# Patient Record
Sex: Female | Born: 1955 | Race: White | Hispanic: No | Marital: Single | Smoking: Former smoker
Health system: Southern US, Community
[De-identification: ages and names within clinical notes are randomized; demographics above are authoritative.]

## PROBLEM LIST (undated history)

## (undated) DIAGNOSIS — I10 Essential (primary) hypertension: Secondary | ICD-10-CM

## (undated) DIAGNOSIS — E669 Obesity, unspecified: Secondary | ICD-10-CM

## (undated) DIAGNOSIS — Z8719 Personal history of other diseases of the digestive system: Secondary | ICD-10-CM

## (undated) DIAGNOSIS — Z8711 Personal history of peptic ulcer disease: Secondary | ICD-10-CM

## (undated) HISTORY — PX: GASTRIC BYPASS: SHX52

## (undated) HISTORY — PX: ABDOMINAL HYSTERECTOMY: SHX81

## (undated) HISTORY — PX: HERNIA REPAIR: SHX51

## (undated) HISTORY — PX: ABDOMINAL SURGERY: SHX537

---

## 2009-03-27 ENCOUNTER — Emergency Department (HOSPITAL_COMMUNITY): Admission: EM | Admit: 2009-03-27 | Discharge: 2009-03-27 | Payer: Self-pay | Admitting: Emergency Medicine

## 2010-04-04 ENCOUNTER — Emergency Department (HOSPITAL_COMMUNITY): Admission: EM | Admit: 2010-04-04 | Discharge: 2010-04-04 | Payer: Self-pay | Admitting: Emergency Medicine

## 2010-04-09 ENCOUNTER — Emergency Department (HOSPITAL_COMMUNITY): Admission: EM | Admit: 2010-04-09 | Discharge: 2010-04-09 | Payer: Self-pay | Admitting: Emergency Medicine

## 2010-04-18 ENCOUNTER — Emergency Department (HOSPITAL_COMMUNITY): Admission: EM | Admit: 2010-04-18 | Discharge: 2010-04-18 | Payer: Self-pay | Admitting: Family Medicine

## 2010-09-03 ENCOUNTER — Inpatient Hospital Stay (HOSPITAL_COMMUNITY): Admission: EM | Admit: 2010-09-03 | Discharge: 2010-09-10 | Payer: Self-pay | Admitting: Emergency Medicine

## 2011-02-06 LAB — BASIC METABOLIC PANEL
BUN: 9 mg/dL (ref 6–23)
Calcium: 8.7 mg/dL (ref 8.4–10.5)
Creatinine, Ser: 0.73 mg/dL (ref 0.4–1.2)
GFR calc Af Amer: >60 mL/min (ref >60)
GFR calc non Af Amer: >60 mL/min (ref >60)

## 2011-02-07 LAB — BASIC METABOLIC PANEL
BUN: 13 mg/dL (ref 6–23)
BUN: 4 mg/dL — ABNORMAL LOW (ref 6–23)
BUN: 9 mg/dL (ref 6–23)
CO2: 28 mEq/L (ref 19–32)
CO2: 28 mEq/L (ref 19–32)
Calcium: 8.4 mg/dL (ref 8.4–10.5)
Calcium: 8.5 mg/dL (ref 8.4–10.5)
Chloride: 104 mEq/L (ref 96–112)
Chloride: 97 mEq/L (ref 96–112)
Creatinine, Ser: 0.63 mg/dL (ref 0.4–1.2)
Creatinine, Ser: 0.79 mg/dL (ref 0.4–1.2)
Creatinine, Ser: 0.82 mg/dL (ref 0.4–1.2)
GFR calc non Af Amer: >60 mL/min (ref >60)
GFR calc non Af Amer: >60 mL/min (ref >60)
Glucose, Bld: 121 mg/dL — ABNORMAL HIGH (ref 70–99)
Glucose, Bld: 133 mg/dL — ABNORMAL HIGH (ref 70–99)
Glucose, Bld: 138 mg/dL — ABNORMAL HIGH (ref 70–99)
Glucose, Bld: 81 mg/dL (ref 70–99)
Potassium: 3.9 mEq/L (ref 3.5–5.1)
Potassium: 3.9 mEq/L (ref 3.5–5.1)
Potassium: 3.9 mEq/L (ref 3.5–5.1)
Sodium: 132 mEq/L — ABNORMAL LOW (ref 135–145)

## 2011-02-07 LAB — CBC
HCT: 38 % (ref 36.0–46.0)
HCT: 38.7 % (ref 36.0–46.0)
HCT: 40.9 % (ref 36.0–46.0)
Hemoglobin: 12.5 g/dL (ref 12.0–15.0)
MCH: 28.2 pg (ref 26.0–34.0)
MCH: 28.2 pg (ref 26.0–34.0)
MCHC: 32 g/dL (ref 30.0–36.0)
MCHC: 33 g/dL (ref 30.0–36.0)
MCV: 85.6 fL (ref 78.0–100.0)
MCV: 88.2 fL (ref 78.0–100.0)
Platelets: 223 10*3/uL (ref 150–400)
Platelets: 230 10*3/uL (ref 150–400)
Platelets: 269 10*3/uL (ref 150–400)
Platelets: 287 10*3/uL (ref 150–400)
RBC: 5.13 MIL/uL — ABNORMAL HIGH (ref 3.87–5.11)
RDW: 13.7 % (ref 11.5–15.5)
RDW: 13.8 % (ref 11.5–15.5)
RDW: 13.9 % (ref 11.5–15.5)
WBC: 13.4 10*3/uL — ABNORMAL HIGH (ref 4.0–10.5)
WBC: 14.2 10*3/uL — ABNORMAL HIGH (ref 4.0–10.5)

## 2011-02-07 LAB — URINALYSIS, ROUTINE W REFLEX MICROSCOPIC
Bilirubin Urine: NEGATIVE
Glucose, UA: NEGATIVE mg/dL
Hgb urine dipstick: NEGATIVE
Ketones, ur: 15 mg/dL — AB
Nitrite: NEGATIVE
Protein, ur: NEGATIVE mg/dL
Specific Gravity, Urine: 1.025 (ref 1.005–1.030)
Urobilinogen, UA: 0.2 mg/dL (ref 0.0–1.0)
pH: 6 (ref 5.0–8.0)

## 2011-02-07 LAB — DIFFERENTIAL
Basophils Absolute: 0 10*3/uL (ref 0.0–0.1)
Eosinophils Relative: 1 % (ref 0–5)
Lymphocytes Relative: 30 % (ref 12–46)
Lymphs Abs: 4 10*3/uL (ref 0.7–4.0)
Neutrophils Relative %: 64 % (ref 43–77)

## 2011-02-07 LAB — URINE MICROSCOPIC-ADD ON

## 2011-02-07 LAB — POCT I-STAT, CHEM 8
BUN: 23 mg/dL (ref 6–23)
Calcium, Ion: 1.09 mmol/L — ABNORMAL LOW (ref 1.12–1.32)
Chloride: 104 meq/L (ref 96–112)
Creatinine, Ser: 1 mg/dL (ref 0.4–1.2)
Glucose, Bld: 134 mg/dL — ABNORMAL HIGH (ref 70–99)
HCT: 46 % (ref 36.0–46.0)
Hemoglobin: 15.6 g/dL — ABNORMAL HIGH (ref 12.0–15.0)
Potassium: 3.4 meq/L — ABNORMAL LOW (ref 3.5–5.1)
Sodium: 138 meq/L (ref 135–145)
TCO2: 27 mmol/L (ref 0–100)

## 2011-03-05 LAB — POCT I-STAT, CHEM 8
BUN: 13 mg/dL (ref 6–23)
Calcium, Ion: 1.15 mmol/L (ref 1.12–1.32)
Chloride: 103 mEq/L (ref 96–112)
Creatinine, Ser: 1 mg/dL (ref 0.4–1.2)
Glucose, Bld: 107 mg/dL — ABNORMAL HIGH (ref 70–99)
HCT: 42 % (ref 36.0–46.0)
Hemoglobin: 14.3 g/dL (ref 12.0–15.0)
Potassium: 3.6 mEq/L (ref 3.5–5.1)
Sodium: 138 mEq/L (ref 135–145)
TCO2: 27 mmol/L (ref 0–100)

## 2011-03-05 LAB — URINALYSIS, ROUTINE W REFLEX MICROSCOPIC
Bilirubin Urine: NEGATIVE
Glucose, UA: NEGATIVE mg/dL
Ketones, ur: NEGATIVE mg/dL
Leukocytes, UA: NEGATIVE
Nitrite: NEGATIVE
Protein, ur: NEGATIVE mg/dL
Specific Gravity, Urine: 1.02 (ref 1.005–1.030)
Urobilinogen, UA: 0.2 mg/dL (ref 0.0–1.0)
pH: 7 (ref 5.0–8.0)

## 2011-03-05 LAB — BASIC METABOLIC PANEL
BUN: 11 mg/dL (ref 6–23)
CO2: 28 mEq/L (ref 19–32)
Calcium: 9.3 mg/dL (ref 8.4–10.5)
Chloride: 103 mEq/L (ref 96–112)
Creatinine, Ser: 0.89 mg/dL (ref 0.4–1.2)
GFR calc Af Amer: >60 mL/min (ref >60)
GFR calc non Af Amer: >60 mL/min (ref >60)
Glucose, Bld: 109 mg/dL — ABNORMAL HIGH (ref 70–99)
Potassium: 3.6 mEq/L (ref 3.5–5.1)
Sodium: 137 mEq/L (ref 135–145)

## 2011-03-05 LAB — URINE MICROSCOPIC-ADD ON

## 2011-03-05 LAB — CBC
HCT: 39.4 % (ref 36.0–46.0)
MCHC: 34.7 g/dL (ref 30.0–36.0)
MCV: 83.9 fL (ref 78.0–100.0)
Platelets: 240 10*3/uL (ref 150–400)
WBC: 7.4 10*3/uL (ref 4.0–10.5)

## 2011-03-05 LAB — DIFFERENTIAL
Basophils Relative: 1 % (ref 0–1)
Eosinophils Absolute: 0.2 10*3/uL (ref 0.0–0.7)
Eosinophils Relative: 3 % (ref 0–5)
Lymphs Abs: 2.4 10*3/uL (ref 0.7–4.0)
Monocytes Relative: 9 % (ref 3–12)
Neutrophils Relative %: 56 % (ref 43–77)

## 2011-03-05 LAB — URINE CULTURE

## 2013-03-23 ENCOUNTER — Other Ambulatory Visit: Payer: Self-pay | Admitting: Family Medicine

## 2013-03-23 ENCOUNTER — Ambulatory Visit
Admission: RE | Admit: 2013-03-23 | Discharge: 2013-03-23 | Disposition: A | Discharge status: Home / Self Care | Payer: BC Managed Care – PPO | Source: Ambulatory Visit | Attending: Family Medicine | Admitting: Family Medicine

## 2013-03-23 DIAGNOSIS — R1900 Intra-abdominal and pelvic swelling, mass and lump, unspecified site: Secondary | ICD-10-CM

## 2013-03-23 MED ORDER — IOHEXOL 300 MG/ML  SOLN
125.0000 mL | Freq: Once | INTRAMUSCULAR | Status: AC | PRN
  Administered 2013-03-23: 125 mL via INTRAVENOUS

## 2013-03-24 ENCOUNTER — Encounter (HOSPITAL_COMMUNITY): Payer: Self-pay | Admitting: *Deleted

## 2013-03-24 ENCOUNTER — Inpatient Hospital Stay (HOSPITAL_COMMUNITY)
Admission: EM | Admit: 2013-03-24 | Discharge: 2013-03-29 | DRG: 189 | Disposition: A | Discharge status: Home Health | Payer: BC Managed Care – PPO | Attending: General Surgery | Admitting: General Surgery

## 2013-03-24 DIAGNOSIS — IMO0002 Reserved for concepts with insufficient information to code with codable children: Secondary | ICD-10-CM | POA: Diagnosis present

## 2013-03-24 DIAGNOSIS — E669 Obesity, unspecified: Secondary | ICD-10-CM | POA: Diagnosis present

## 2013-03-24 DIAGNOSIS — Z79899 Other long term (current) drug therapy: Secondary | ICD-10-CM

## 2013-03-24 DIAGNOSIS — Z6841 Body Mass Index (BMI) 40.0 and over, adult: Secondary | ICD-10-CM

## 2013-03-24 DIAGNOSIS — K651 Peritoneal abscess: Principal | ICD-10-CM | POA: Diagnosis present

## 2013-03-24 DIAGNOSIS — R000 Tachycardia, unspecified: Secondary | ICD-10-CM

## 2013-03-24 DIAGNOSIS — Z87891 Personal history of nicotine dependence: Secondary | ICD-10-CM

## 2013-03-24 DIAGNOSIS — L02211 Cutaneous abscess of abdominal wall: Secondary | ICD-10-CM

## 2013-03-24 DIAGNOSIS — R6510 Systemic inflammatory response syndrome (SIRS) of non-infectious origin without acute organ dysfunction: Secondary | ICD-10-CM

## 2013-03-24 DIAGNOSIS — I959 Hypotension, unspecified: Secondary | ICD-10-CM

## 2013-03-24 DIAGNOSIS — A4902 Methicillin resistant Staphylococcus aureus infection, unspecified site: Secondary | ICD-10-CM | POA: Diagnosis present

## 2013-03-24 DIAGNOSIS — R109 Unspecified abdominal pain: Secondary | ICD-10-CM

## 2013-03-24 DIAGNOSIS — I10 Essential (primary) hypertension: Secondary | ICD-10-CM | POA: Diagnosis present

## 2013-03-24 HISTORY — DX: Personal history of peptic ulcer disease: Z87.11

## 2013-03-24 HISTORY — DX: Essential (primary) hypertension: I10

## 2013-03-24 HISTORY — DX: Personal history of other diseases of the digestive system: Z87.19

## 2013-03-24 HISTORY — DX: Obesity, unspecified: E66.9

## 2013-03-24 LAB — CG4 I-STAT (LACTIC ACID): Lactic Acid, Venous: 1.24 mmol/L (ref 0.5–2.2)

## 2013-03-24 LAB — URINALYSIS, MICROSCOPIC ONLY
Nitrite: NEGATIVE
Specific Gravity, Urine: 1.024 (ref 1.005–1.030)
pH: 5.5 (ref 5.0–8.0)

## 2013-03-24 LAB — CBC WITH DIFFERENTIAL/PLATELET
Basophils Relative: 0 % (ref 0–1)
Eosinophils Absolute: 0.1 10*3/uL (ref 0.0–0.7)
Eosinophils Relative: 1 % (ref 0–5)
Hemoglobin: 11.4 g/dL — ABNORMAL LOW (ref 12.0–15.0)
Lymphs Abs: 2.1 10*3/uL (ref 0.7–4.0)
MCH: 27.2 pg (ref 26.0–34.0)
MCHC: 34.2 g/dL (ref 30.0–36.0)
MCV: 79.5 fL (ref 78.0–100.0)
Monocytes Relative: 8 % (ref 3–12)
Platelets: 435 10*3/uL — ABNORMAL HIGH (ref 150–400)
RBC: 4.19 MIL/uL (ref 3.87–5.11)

## 2013-03-24 LAB — COMPREHENSIVE METABOLIC PANEL
Albumin: 4.1 g/dL (ref 3.5–5.2)
BUN: 31 mg/dL — ABNORMAL HIGH (ref 6–23)
Calcium: 10 mg/dL (ref 8.4–10.5)
GFR calc Af Amer: 27 mL/min — ABNORMAL LOW (ref >90)
Glucose, Bld: 103 mg/dL — ABNORMAL HIGH (ref 70–99)
Total Protein: 8.9 g/dL — ABNORMAL HIGH (ref 6.0–8.3)

## 2013-03-24 MED ORDER — SODIUM CHLORIDE 0.9 % IV BOLUS (SEPSIS)
1000.0000 mL | Freq: Once | INTRAVENOUS | Status: AC
  Administered 2013-03-24: 1000 mL via INTRAVENOUS

## 2013-03-24 MED ORDER — MORPHINE SULFATE 4 MG/ML IJ SOLN
4.0000 mg | INTRAMUSCULAR | Status: DC | PRN
  Administered 2013-03-25 (×2): 4 mg via INTRAVENOUS
  Filled 2013-03-24 (×2): qty 1

## 2013-03-24 MED ORDER — SODIUM CHLORIDE 0.9 % IJ SOLN
INTRAMUSCULAR | Status: AC
  Administered 2013-03-24: 10 mL
  Filled 2013-03-24: qty 10

## 2013-03-24 MED ORDER — PANTOPRAZOLE SODIUM 40 MG IV SOLR
40.0000 mg | Freq: Every day | INTRAVENOUS | Status: DC
  Administered 2013-03-24 – 2013-03-26 (×3): 40 mg via INTRAVENOUS
  Filled 2013-03-24 (×6): qty 40

## 2013-03-24 MED ORDER — PIPERACILLIN-TAZOBACTAM 3.375 G IVPB
3.3750 g | Freq: Three times a day (TID) | INTRAVENOUS | Status: DC
  Administered 2013-03-25 – 2013-03-29 (×13): 3.375 g via INTRAVENOUS
  Filled 2013-03-24 (×17): qty 50

## 2013-03-24 MED ORDER — HYDROMORPHONE HCL PF 1 MG/ML IJ SOLN
1.0000 mg | Freq: Once | INTRAMUSCULAR | Status: AC
  Administered 2013-03-24: 1 mg via INTRAVENOUS
  Filled 2013-03-24: qty 1

## 2013-03-24 MED ORDER — VANCOMYCIN HCL 10 G IV SOLR
1500.0000 mg | INTRAVENOUS | Status: DC
  Filled 2013-03-24: qty 1500

## 2013-03-24 MED ORDER — VANCOMYCIN HCL 10 G IV SOLR
1500.0000 mg | Freq: Once | INTRAVENOUS | Status: AC
  Administered 2013-03-24: 1500 mg via INTRAVENOUS
  Filled 2013-03-24: qty 1500

## 2013-03-24 MED ORDER — ONDANSETRON HCL 4 MG/2ML IJ SOLN
4.0000 mg | Freq: Once | INTRAMUSCULAR | Status: AC
  Administered 2013-03-24: 4 mg via INTRAVENOUS
  Filled 2013-03-24: qty 2

## 2013-03-24 MED ORDER — PIPERACILLIN-TAZOBACTAM 3.375 G IVPB 30 MIN
3.3750 g | Freq: Once | INTRAVENOUS | Status: AC
  Administered 2013-03-24: 3.375 g via INTRAVENOUS
  Filled 2013-03-24: qty 50

## 2013-03-24 MED ORDER — ONDANSETRON HCL 4 MG/2ML IJ SOLN
4.0000 mg | Freq: Four times a day (QID) | INTRAMUSCULAR | Status: DC | PRN
  Administered 2013-03-26: 4 mg via INTRAVENOUS
  Filled 2013-03-24: qty 2

## 2013-03-24 MED ORDER — KCL IN DEXTROSE-NACL 20-5-0.9 MEQ/L-%-% IV SOLN
INTRAVENOUS | Status: DC
  Administered 2013-03-24: 23:00:00 via INTRAVENOUS
  Filled 2013-03-24 (×4): qty 1000

## 2013-03-24 NOTE — H&P (Signed)
[name] Walker is an 57 y.o. female.   Chief Complaint: abdominal pain HPI: 57 yo wf who has a history of ventral hernia repair emergently by Dr. Dwain Sarna in 2011. Last Friday she felt a lump on her right upper abdominal wall. It has been tender for her. She denies any fever. She went to her medical doc on Tuesday who ordered a CT. She was called today and told to go to ER. CT shows an abscess partly in subq but mostly intraabdominal. No sign of obstruction or free air. She has had some low BP but no tachycardia.  Past Medical History  Diagnosis Date  . Hypertension   . Obesity   . History of stomach ulcers     Past Surgical History  Procedure Laterality Date  . Hernia repair    . Abdominal surgery      No family history on file. Social History:  reports that she has quit smoking. She does not have any smokeless tobacco history on file. She reports that  drinks alcohol. She reports that she does not use illicit drugs.  Allergies:  Allergies  Allergen Reactions  . Tramadol Nausea And Vomiting     (Not in a hospital admission)  Results for orders placed during the hospital encounter of 03/24/13 (from the past 48 hour(s))  CBC WITH DIFFERENTIAL     Status: Abnormal   Collection Time    03/24/13  3:44 PM      Result Value Range   WBC 14.5 (*) 4.0 - 10.5 K/uL   RBC 4.19  3.87 - 5.11 MIL/uL   Hemoglobin 11.4 (*) 12.0 - 15.0 g/dL   HCT 16.1 (*) 09.6 - 04.5 %   MCV 79.5  78.0 - 100.0 fL   MCH 27.2  26.0 - 34.0 pg   MCHC 34.2  30.0 - 36.0 g/dL   RDW 40.9  81.1 - 91.4 %   Platelets 435 (*) 150 - 400 K/uL   Neutrophils Relative 77  43 - 77 %   Neutro Abs 11.2 (*) 1.7 - 7.7 K/uL   Lymphocytes Relative 14  12 - 46 %   Lymphs Abs 2.1  0.7 - 4.0 K/uL   Monocytes Relative 8  3 - 12 %   Monocytes Absolute 1.1 (*) 0.1 - 1.0 K/uL   Eosinophils Relative 1  0 - 5 %   Eosinophils Absolute 0.1  0.0 - 0.7 K/uL   Basophils Relative 0  0 - 1 %   Basophils Absolute 0.1  0.0 - 0.1 K/uL   COMPREHENSIVE METABOLIC PANEL     Status: Abnormal   Collection Time    03/24/13  3:44 PM      Result Value Range   Sodium 136  135 - 145 mEq/L   Potassium 4.0  3.5 - 5.1 mEq/L   Chloride 96  96 - 112 mEq/L   CO2 22  19 - 32 mEq/L   Glucose, Bld 103 (*) 70 - 99 mg/dL   BUN 31 (*) 6 - 23 mg/dL   Creatinine, Ser 7.82 (*) 0.50 - 1.10 mg/dL   Calcium 95.6  8.4 - 21.3 mg/dL   Total Protein 8.9 (*) 6.0 - 8.3 g/dL   Albumin 4.1  3.5 - 5.2 g/dL   AST 23  0 - 37 U/L   ALT 17  0 - 35 U/L   Alkaline Phosphatase 78  39 - 117 U/L   Total Bilirubin 0.4  0.3 - 1.2 mg/dL   GFR calc  non Af Amer 23 (*) >90 mL/min   GFR calc Af Amer 27 (*) >90 mL/min   Comment:            The eGFR has been calculated     using the CKD EPI equation.     This calculation has not been     validated in all clinical     situations.     eGFR's persistently     <90 mL/min signify     possible Chronic Kidney Disease.  CG4 I-STAT (LACTIC ACID)     Status: None   Collection Time    03/24/13  4:02 PM      Result Value Range   Lactic Acid, Venous 1.24  0.5 - 2.2 mmol/L   Ct Abdomen Pelvis W Contrast  03/23/2013  *RADIOLOGY REPORT*  Clinical Data: Right-sided abdominal mass for the past 5 days. Redness and tenderness.  Status post hernia repair in 2011. Evaluate for abdominal wall abscess.  CT ABDOMEN AND PELVIS WITH CONTRAST  Technique:  Multidetector CT imaging of the abdomen and pelvis was performed following the standard protocol during bolus administration of intravenous contrast.  Contrast: OMNIPAQUE IOHEXOL 300 MG/ML  SOLN  Comparison: CT of the abdomen and pelvis 09/03/2010.  Findings:  Lung Bases: Several tiny 1-2 mm pulmonary nodules are seen scattered throughout the lungs bilaterally, and are highly nonspecific but favored to be benign.  Abdomen/Pelvis:  The appearance of the liver, gallbladder, pancreas, spleen, bilateral adrenal glands and bilateral kidneys is unremarkable.  Status post gastric bypass  procedure.  A trace volume of ascites.  Numerous colonic diverticula, without surrounding inflammatory changes to suggest an acute diverticulitis at this time.  Pneumoperitoneum.  No pathologic distension of small bowel to suggest bowel obstruction at this time.  In the anterior aspect of the abdomen there is a thick-walled rim- enhancing fluid collection that measures approximately 3.5 x 8.1 x 8.9 cm which is intimately associated with the anterior abdominal wall musculature, as well as several loops of underlying small bowel.  Notably, the small bowel adjacent to this are well opacified with oral contrast material, but no definite oral contrast material extends into this collection.  On several images there is a suggestion of a potential fistulous tract from this collection through the anterior abdominal wall musculature into the overlying subcutaneous fat, however, this is not confirmed on this CT examination.  However, there is extensive soft tissue stranding throughout the subcutaneous fat, compatible with cellulitis. Additionally, there is a complex multilocular rim enhancing fluid collection in the subcutaneous fat measuring approximately 5.4 x 2.5 x 5.5 cm, compatible with a subcutaneous abscess.  Musculoskeletal: There are no aggressive appearing lytic or blastic lesions noted in the visualized portions of the skeleton.  Anterior abdominal wall abscess, as above.  IMPRESSION: 1. 3.5 x 8.1 x 8.9 cm rim enhancing fluid collection in the anterior aspect of the abdomen compatible with a large abscess. This is intimately associated with the anterior abdominal wall, and likely has an associated fistulous tract into the subcutaneous fat where there is an additional 5.4 x 2.5 x 5.5 cm abscess and surrounding cellulitis. 2.  Postoperative changes of gastric bypass. 3.  Colonic diverticulosis without findings to suggest acute diverticulitis at this time. 4.  Additional incidental findings, as above.   Original Report  Authenticated By: Trudie Reed, M.D.     Review of Systems  Constitutional: Negative.   HENT: Negative.   Eyes: Negative.   Respiratory: Negative.  Cardiovascular: Negative.   Gastrointestinal: Positive for abdominal pain.  Genitourinary: Negative.   Musculoskeletal: Negative.   Skin: Negative.   Neurological: Negative.   Endo/Heme/Allergies: Negative.   Psychiatric/Behavioral: Negative.     Blood pressure 89/30, pulse 79, temperature 98.9 F (37.2 C), temperature source Oral, resp. rate 14, SpO2 100.00%. Physical Exam  Constitutional: She is oriented to person, place, and time. She appears well-developed and well-nourished.  HENT:  Head: Normocephalic and atraumatic.  Eyes: Conjunctivae and EOM are normal. Pupils are equal, round, and reactive to light.  Neck: Normal range of motion. Neck supple.  Cardiovascular: Normal rate, regular rhythm and normal heart sounds.   Respiratory: Effort normal and breath sounds normal.  GI: Soft. Bowel sounds are normal. She exhibits no distension.  Tender palpable mass just above and to right of umbilicus. No cellulitis  Musculoskeletal: Normal range of motion.  Neurological: She is alert and oriented to person, place, and time.  Skin: Skin is warm and dry.  Psychiatric: She has a normal mood and affect. Her behavior is normal.     Assessment/Plan The pt has a large intraabdominal abscess. I suspect she had some residual seroma after her hernia repair that has subsequently become infected. I think the location of the abscess is very amenable to perc drain. Will admit for resuscitation and broad spectrum abx. Will ask IR to drain and culture fluid. Monitor closely  TOTH III,PAUL S 03/24/2013, 6:58 PM

## 2013-03-24 NOTE — Progress Notes (Signed)
ANTIBIOTIC CONSULT NOTE - INITIAL  Pharmacy Consult for vancomycin and zosyn Indication: large intraabdominal abscess  Allergies  Allergen Reactions  . Tramadol Nausea And Vomiting    Patient Measurements: Height: 5\' 7"  (170.2 cm) (per pt report) Weight: 256 lb (116.121 kg) IBW/kg (Calculated) : 61.6  Vital Signs: Temp: 98.9 F (37.2 C) (04/30 1509) Temp src: Oral (04/30 1509) BP: 112/46 mmHg (04/30 1930) Pulse Rate: 88 (04/30 1930) Intake/Output from previous day:   Intake/Output from this shift:    Labs:  Recent Labs  03/24/13 1544  WBC 14.5*  HGB 11.4*  PLT 435*  CREATININE 2.30*   Estimated Creatinine Clearance: 36.8 ml/min (by C-G formula based on Cr of 2.3). No results found for this basename: VANCOTROUGH, VANCOPEAK, VANCORANDOM, GENTTROUGH, GENTPEAK, GENTRANDOM, TOBRATROUGH, TOBRAPEAK, TOBRARND, AMIKACINPEAK, AMIKACINTROU, AMIKACIN,  in the last 72 hours   Microbiology: No results found for this or any previous visit (from the past 720 hour(s)).  Medical History: Past Medical History  Diagnosis Date  . Hypertension   . Obesity   . History of stomach ulcers     Medications: ibuprofen PRN, lisinopril-HCTZ, MVI  Assessment: [name] Walker is a very pleasant 56 yo WF to start vanc and zosyn for a large intraabdominal abscess.  She had a ventral hernia repaired emergently in 2011.  She has a large intraabdominal abscess by CT.   Her creatinine is elevated at 2.3.  She states it is usually WNL.  She is being aggressively rehydrated and hopefully her creatinine will improve.  Wt = 116 kg. Creat cl ~ 37 ml/min.  Her WBC is 14.5.  The plan is to have IR drain and culture the abscess fluid.  Goal of Therapy:  Vancomycin trough level 15-20 mcg/ml  Plan:  1. Zosyn 3.375 gm IV x 1 dose over 30 minutes in ED then 3.375 gm IV q8h, infuse each dose over 4 hours 2. Vancomycin 1500 mg IV q24 3. F/u creatinine closely - expect it to improve with rehydration and will need  to change vancomycin dose 4. F/u fever, WBC, culture data, clinical course. Herby Abraham, Pharm.D. [phone] 03/24/2013 8:10 PM

## 2013-03-24 NOTE — ED Notes (Signed)
Patient is resting comfortably. 

## 2013-03-24 NOTE — ED Provider Notes (Signed)
History     CSN: [id number]  Arrival date & time 03/24/13  1458   First MD Initiated Contact with Patient 03/24/13 1527      Chief Complaint  Patient presents with  . Abscess    (Consider location/radiation/quality/duration/timing/severity/associated sxs/prior treatment) HPI Comments: 30 y F with PMH of HTN and obesity that is s/p hysterectomy, ventral hernia repair and perforated ulcer repair (4098) here after referral from PCP for abd wall abscess that measures 3.5x8.1x8.9 cm. Pt reports several episodes of bilious emesis just PTA and poor PO intake for the past day.  No urinary sx.   Patient is a 57 y.o. female presenting with abdominal pain. The history is provided by the patient.  Abdominal Pain Pain location: r side. Pain quality: aching   Pain radiates to:  Does not radiate Pain severity:  Moderate Onset quality:  Gradual Timing:  Constant Progression:  Worsening Associated symptoms: vomiting   Associated symptoms: no diarrhea, no dysuria, no hematemesis, no hematochezia and no melena     Past Medical History  Diagnosis Date  . Hypertension   . Obesity   . History of stomach ulcers     Past Surgical History  Procedure Laterality Date  . Hernia repair    . Abdominal surgery      No family history on file.  History  Substance Use Topics  . Smoking status: Former Games developer  . Smokeless tobacco: Not on file  . Alcohol Use: Yes     Comment: soc    OB History   Grav Para Term Preterm Abortions TAB SAB Ect Mult Living                  Review of Systems  Gastrointestinal: Positive for vomiting and abdominal pain. Negative for diarrhea, melena, hematochezia and hematemesis.  Genitourinary: Negative for dysuria.  All other systems reviewed and are negative.    Allergies  Tramadol  Home Medications  No current outpatient prescriptions on file.  BP 90/68  Pulse 126  Temp(Src) 98.9 F (37.2 C) (Oral)  Resp 22  SpO2 96%  Physical Exam  Vitals  reviewed. Constitutional: She is oriented to person, place, and time. She appears well-developed and well-nourished.  HENT:  Right Ear: External ear normal.  Left Ear: External ear normal.  Mouth/Throat: No oropharyngeal exudate.  Eyes: Conjunctivae and EOM are normal. Pupils are equal, round, and reactive to light.  Neck: Normal range of motion. Neck supple.  Cardiovascular: Regular rhythm, normal heart sounds and intact distal pulses.  Tachycardia present.  Exam reveals no gallop and no friction rub.   No murmur heard. Pulmonary/Chest: Effort normal and breath sounds normal.  Abdominal: Soft. Bowel sounds are normal. She exhibits no distension. There is tenderness.    Musculoskeletal: Normal range of motion. She exhibits no edema.  Neurological: She is alert and oriented to person, place, and time. No cranial nerve deficit.  Skin: Skin is warm and dry. No rash noted.  Psychiatric: She has a normal mood and affect.    ED Course  Procedures (including critical care time)  Labs Reviewed  CBC WITH DIFFERENTIAL - Abnormal; Notable for the following:    WBC 14.5 (*)    Hemoglobin 11.4 (*)    HCT 33.3 (*)    Platelets 435 (*)    Neutro Abs 11.2 (*)    Monocytes Absolute 1.1 (*)    All other components within normal limits  COMPREHENSIVE METABOLIC PANEL - Abnormal; Notable for the following:  Glucose, Bld 103 (*)    BUN 31 (*)    Creatinine, Ser 2.30 (*)    Total Protein 8.9 (*)    GFR calc non Af Amer 23 (*)    GFR calc Af Amer 27 (*)    All other components within normal limits  URINALYSIS, MICROSCOPIC ONLY  CG4 I-STAT (LACTIC ACID)   Ct Abdomen Pelvis W Contrast  03/23/2013  *RADIOLOGY REPORT*  Clinical Data: Right-sided abdominal mass for the past 5 days. Redness and tenderness.  Status post hernia repair in 2011. Evaluate for abdominal wall abscess.  CT ABDOMEN AND PELVIS WITH CONTRAST  Technique:  Multidetector CT imaging of the abdomen and pelvis was performed following  the standard protocol during bolus administration of intravenous contrast.  Contrast: OMNIPAQUE IOHEXOL 300 MG/ML  SOLN  Comparison: CT of the abdomen and pelvis 09/03/2010.  Findings:  Lung Bases: Several tiny 1-2 mm pulmonary nodules are seen scattered throughout the lungs bilaterally, and are highly nonspecific but favored to be benign.  Abdomen/Pelvis:  The appearance of the liver, gallbladder, pancreas, spleen, bilateral adrenal glands and bilateral kidneys is unremarkable.  Status post gastric bypass procedure.  A trace volume of ascites.  Numerous colonic diverticula, without surrounding inflammatory changes to suggest an acute diverticulitis at this time.  Pneumoperitoneum.  No pathologic distension of small bowel to suggest bowel obstruction at this time.  In the anterior aspect of the abdomen there is a thick-walled rim- enhancing fluid collection that measures approximately 3.5 x 8.1 x 8.9 cm which is intimately associated with the anterior abdominal wall musculature, as well as several loops of underlying small bowel.  Notably, the small bowel adjacent to this are well opacified with oral contrast material, but no definite oral contrast material extends into this collection.  On several images there is a suggestion of a potential fistulous tract from this collection through the anterior abdominal wall musculature into the overlying subcutaneous fat, however, this is not confirmed on this CT examination.  However, there is extensive soft tissue stranding throughout the subcutaneous fat, compatible with cellulitis. Additionally, there is a complex multilocular rim enhancing fluid collection in the subcutaneous fat measuring approximately 5.4 x 2.5 x 5.5 cm, compatible with a subcutaneous abscess.  Musculoskeletal: There are no aggressive appearing lytic or blastic lesions noted in the visualized portions of the skeleton.  Anterior abdominal wall abscess, as above.  IMPRESSION: 1. 3.5 x 8.1 x 8.9 cm  rim enhancing fluid collection in the anterior aspect of the abdomen compatible with a large abscess. This is intimately associated with the anterior abdominal wall, and likely has an associated fistulous tract into the subcutaneous fat where there is an additional 5.4 x 2.5 x 5.5 cm abscess and surrounding cellulitis. 2.  Postoperative changes of gastric bypass. 3.  Colonic diverticulosis without findings to suggest acute diverticulitis at this time. 4.  Additional incidental findings, as above.   Original Report Authenticated By: Trudie Reed, M.D.     Date: 03/25/2013  Rate: 113  Rhythm: sinus tachycardia  QRS Axis: normal  Intervals: normal  ST/T Wave abnormalities: normal  Conduction Disutrbances:none  Narrative Interpretation: Sinus tach, otherwise normal  Old EKG Reviewed: none available    1. Abdominal wall abscess   2. Tachycardia   3. Hypotension   4. SIRS (systemic inflammatory response syndrome)   5.  Acute Kidney Injury   MDM    54 y F with PMH of HTN and obesity that is s/p hysterectomy, ventral hernia  repair and perforated ulcer repair (1983) here after referral from PCP for abd wall abscess that measures 3.5x8.1x8.9 cm. Pt reports several episodes of bilious emesis just PTA and poor PO intake for the past day.  No urinary sx.   Tachycardic and hypotensive on arrival, but well appearing, NAD.  Abd with palpable fluctuant mass.  Labs, lactate, NS boluses.  General Surgery consult.  6:48 PM Will give 3rd NS bolus.  HR improved.  Pt still well appearing.   8:40 PM Pt admitted.  BP improved 110s/50s.  HR 70s  Disposition: Admit  Condition: Stable  Pt seen in conjunction with my attending, Dr. Manus Gunning.  Oleh Genin, MD PGY-II [facility] Emergency Medicine Resident    Oleh Genin, MD 03/25/13 [phone]

## 2013-03-24 NOTE — ED Notes (Signed)
Pt states on Friday noticed a mass and since now has 2 in her mid abdomen and had CT scan last nite and was told to come here to the ed.  Pt states started vomiting bile 40 minutes ago and now pain is worse in the right side.

## 2013-03-24 NOTE — ED Notes (Signed)
Patient attempted to urinate, but was unable. Will call when able.

## 2013-03-25 ENCOUNTER — Inpatient Hospital Stay (HOSPITAL_COMMUNITY): Payer: BC Managed Care – PPO

## 2013-03-25 ENCOUNTER — Encounter (HOSPITAL_COMMUNITY): Payer: Self-pay

## 2013-03-25 DIAGNOSIS — L03319 Cellulitis of trunk, unspecified: Secondary | ICD-10-CM

## 2013-03-25 DIAGNOSIS — L02219 Cutaneous abscess of trunk, unspecified: Secondary | ICD-10-CM

## 2013-03-25 LAB — CBC
HCT: 28.3 % — ABNORMAL LOW (ref 36.0–46.0)
Hemoglobin: 9.7 g/dL — ABNORMAL LOW (ref 12.0–15.0)
MCV: 79.3 fL (ref 78.0–100.0)
WBC: 7.3 10*3/uL (ref 4.0–10.5)

## 2013-03-25 LAB — PROTIME-INR
INR: 1.14 (ref 0.00–1.49)
Prothrombin Time: 14.4 seconds (ref 11.6–15.2)

## 2013-03-25 LAB — GLUCOSE, CAPILLARY: Glucose-Capillary: 101 mg/dL — ABNORMAL HIGH (ref 70–99)

## 2013-03-25 LAB — APTT: aPTT: 28 seconds (ref 24–37)

## 2013-03-25 LAB — BASIC METABOLIC PANEL
CO2: 22 mEq/L (ref 19–32)
Chloride: 105 mEq/L (ref 96–112)
GFR calc Af Amer: 54 mL/min — ABNORMAL LOW (ref >90)
Potassium: 4 mEq/L (ref 3.5–5.1)

## 2013-03-25 MED ORDER — VANCOMYCIN HCL IN DEXTROSE 1-5 GM/200ML-% IV SOLN
1000.0000 mg | Freq: Two times a day (BID) | INTRAVENOUS | Status: DC
  Administered 2013-03-25 – 2013-03-29 (×9): 1000 mg via INTRAVENOUS
  Filled 2013-03-25 (×10): qty 200

## 2013-03-25 MED ORDER — KCL IN DEXTROSE-NACL 20-5-0.9 MEQ/L-%-% IV SOLN
INTRAVENOUS | Status: DC
  Administered 2013-03-25 – 2013-03-29 (×4): via INTRAVENOUS
  Filled 2013-03-25 (×7): qty 1000

## 2013-03-25 MED ORDER — MIDAZOLAM HCL 2 MG/2ML IJ SOLN
INTRAMUSCULAR | Status: AC | PRN
  Administered 2013-03-25 (×4): 1 mg via INTRAVENOUS

## 2013-03-25 MED ORDER — FENTANYL CITRATE 0.05 MG/ML IJ SOLN
INTRAMUSCULAR | Status: AC | PRN
  Administered 2013-03-25 (×4): 50 ug via INTRAVENOUS

## 2013-03-25 MED ORDER — WHITE PETROLATUM GEL
Status: AC
  Administered 2013-03-25: 0.2
  Filled 2013-03-25: qty 5

## 2013-03-25 MED ORDER — HYDROCODONE-ACETAMINOPHEN 5-325 MG PO TABS: 1.0000 | ORAL_TABLET | ORAL | Status: DC | PRN

## 2013-03-25 NOTE — Progress Notes (Signed)
Subjective: Has minimal pain, otherwise feels well  Objective: Vital signs in last 24 hours: Temp:  [98.3 F (36.8 C)-98.9 F (37.2 C)] 98.5 F (36.9 C) (05/01 0749) Pulse Rate:  [66-126] 71 (05/01 0741) Resp:  [10-24] 17 (05/01 0741) BP: (72-139)/(30-82) 99/40 mmHg (05/01 0741) SpO2:  [96 %-100 %] 99 % (05/01 0741) Weight:  [256 lb (116.121 kg)-258 lb 6.1 oz (117.2 kg)] 258 lb 6.1 oz (117.2 kg) (04/30 2039) Last BM Date: 03/22/13 (per pt. report)  Intake/Output from previous day: 04/30 0701 - 05/01 0700 In: 1410 [I.V.:860; IV Piggyback:550] Out: 0  Intake/Output this shift:    Looks well Abdomen soft with fullness, tenderness in RUQ, no erythema  Lab Results:   Recent Labs  03/24/13 1544 03/25/13 0430  WBC 14.5* 7.3  HGB 11.4* 9.7*  HCT 33.3* 28.3*  PLT 435* 301   BMET  Recent Labs  03/24/13 1544 03/25/13 0430  NA 136 137  K 4.0 4.0  CL 96 105  CO2 22 22  GLUCOSE 103* 103*  BUN 31* 24*  CREATININE 2.30* 1.28*  CALCIUM 10.0 8.1*   PT/INR  Recent Labs  03/25/13 0657  LABPROT 14.4  INR 1.14   ABG No results found for this basename: PHART, PCO2, PO2, HCO3,  in the last 72 hours  Studies/Results: Ct Abdomen Pelvis W Contrast  03/23/2013  *RADIOLOGY REPORT*  Clinical Data: Right-sided abdominal mass for the past 5 days. Redness and tenderness.  Status post hernia repair in 2011. Evaluate for abdominal wall abscess.  CT ABDOMEN AND PELVIS WITH CONTRAST  Technique:  Multidetector CT imaging of the abdomen and pelvis was performed following the standard protocol during bolus administration of intravenous contrast.  Contrast: OMNIPAQUE IOHEXOL 300 MG/ML  SOLN  Comparison: CT of the abdomen and pelvis 09/03/2010.  Findings:  Lung Bases: Several tiny 1-2 mm pulmonary nodules are seen scattered throughout the lungs bilaterally, and are highly nonspecific but favored to be benign.  Abdomen/Pelvis:  The appearance of the liver, gallbladder, pancreas,  spleen, bilateral adrenal glands and bilateral kidneys is unremarkable.  Status post gastric bypass procedure.  A trace volume of ascites.  Numerous colonic diverticula, without surrounding inflammatory changes to suggest an acute diverticulitis at this time.  Pneumoperitoneum.  No pathologic distension of small bowel to suggest bowel obstruction at this time.  In the anterior aspect of the abdomen there is a thick-walled rim- enhancing fluid collection that measures approximately 3.5 x 8.1 x 8.9 cm which is intimately associated with the anterior abdominal wall musculature, as well as several loops of underlying small bowel.  Notably, the small bowel adjacent to this are well opacified with oral contrast material, but no definite oral contrast material extends into this collection.  On several images there is a suggestion of a potential fistulous tract from this collection through the anterior abdominal wall musculature into the overlying subcutaneous fat, however, this is not confirmed on this CT examination.  However, there is extensive soft tissue stranding throughout the subcutaneous fat, compatible with cellulitis. Additionally, there is a complex multilocular rim enhancing fluid collection in the subcutaneous fat measuring approximately 5.4 x 2.5 x 5.5 cm, compatible with a subcutaneous abscess.  Musculoskeletal: There are no aggressive appearing lytic or blastic lesions noted in the visualized portions of the skeleton.  Anterior abdominal wall abscess, as above.  IMPRESSION: 1. 3.5 x 8.1 x 8.9 cm rim enhancing fluid collection in the anterior aspect of the abdomen compatible with a large abscess. This  is intimately associated with the anterior abdominal wall, and likely has an associated fistulous tract into the subcutaneous fat where there is an additional 5.4 x 2.5 x 5.5 cm abscess and surrounding cellulitis. 2.  Postoperative changes of gastric bypass. 3.  Colonic diverticulosis without findings to  suggest acute diverticulitis at this time. 4.  Additional incidental findings, as above.   Original Report Authenticated By: Trudie Reed, M.D.     Anti-infectives: Anti-infectives   Start     Dose/Rate Route Frequency Ordered Stop   03/25/13 2200  vancomycin (VANCOCIN) 1,500 mg in sodium chloride 0.9 % 500 mL IVPB     1,500 mg 250 mL/hr over 120 Minutes Intravenous Every 24 hours 03/24/13 2006     03/25/13 0600  piperacillin-tazobactam (ZOSYN) IVPB 3.375 g     3.375 g 12.5 mL/hr over 240 Minutes Intravenous 3 times per day 03/24/13 2006     03/24/13 2100  vancomycin (VANCOCIN) 1,500 mg in sodium chloride 0.9 % 500 mL IVPB     1,500 mg 250 mL/hr over 120 Minutes Intravenous  Once 03/24/13 2005 03/24/13 2301   03/24/13 2015  piperacillin-tazobactam (ZOSYN) IVPB 3.375 g     3.375 g 100 mL/hr over 30 Minutes Intravenous  Once 03/24/13 2000 03/24/13 2047      Assessment/Plan: s/p * No surgery found *  Suspected subcutaneous abscess than may or may not communicate with chronic seroma around mesh.  IR to place drains today.  Will know more on how to proceed post drainage.  Will transfer to floor after procedure if she is doing well  LOS: 1 day    Mandell Pangborn A 03/25/2013

## 2013-03-25 NOTE — Procedures (Signed)
Technically successful CT guided drainage catheter placement into left lower abdomen yielding 45 cc of purulent material.   Technically successful CT guided drainage catheter into right anterior abdomen yielding 25 cc of purulent material. Samples from both drains sent to laboratory.   No immediate post procedural complications.

## 2013-03-25 NOTE — H&P (Signed)
[name] Walker is an 57 y.o. female.   Chief Complaint: Abd pain Feels lump in RUQ CT reveals intraabdominal abscess Hx ventral hernia repair 2011 Scheduled now for abscess drain /poss x 2 HPI: HTN; obese  Past Medical History  Diagnosis Date  . Hypertension   . Obesity   . History of stomach ulcers     Past Surgical History  Procedure Laterality Date  . Hernia repair    . Abdominal surgery      History reviewed. No pertinent family history. Social History:  reports that she has quit smoking. She does not have any smokeless tobacco history on file. She reports that  drinks alcohol. She reports that she does not use illicit drugs.  Allergies:  Allergies  Allergen Reactions  . Tramadol Nausea And Vomiting    Medications Prior to Admission  Medication Sig Dispense Refill  . ibuprofen (ADVIL,MOTRIN) 200 MG tablet Take 800 mg by mouth every 6 (six) hours as needed for pain.      Marland Kitchen lisinopril-hydrochlorothiazide (PRINZIDE,ZESTORETIC) 20-25 MG per tablet Take 1 tablet by mouth daily.      . Multiple Vitamin (MULTIVITAMIN WITH MINERALS) TABS Take 1 tablet by mouth daily.        Results for orders placed during the hospital encounter of 03/24/13 (from the past 48 hour(s))  CBC WITH DIFFERENTIAL     Status: Abnormal   Collection Time    03/24/13  3:44 PM      Result Value Range   WBC 14.5 (*) 4.0 - 10.5 K/uL   RBC 4.19  3.87 - 5.11 MIL/uL   Hemoglobin 11.4 (*) 12.0 - 15.0 g/dL   HCT 16.1 (*) 09.6 - 04.5 %   MCV 79.5  78.0 - 100.0 fL   MCH 27.2  26.0 - 34.0 pg   MCHC 34.2  30.0 - 36.0 g/dL   RDW 40.9  81.1 - 91.4 %   Platelets 435 (*) 150 - 400 K/uL   Neutrophils Relative 77  43 - 77 %   Neutro Abs 11.2 (*) 1.7 - 7.7 K/uL   Lymphocytes Relative 14  12 - 46 %   Lymphs Abs 2.1  0.7 - 4.0 K/uL   Monocytes Relative 8  3 - 12 %   Monocytes Absolute 1.1 (*) 0.1 - 1.0 K/uL   Eosinophils Relative 1  0 - 5 %   Eosinophils Absolute 0.1  0.0 - 0.7 K/uL   Basophils Relative 0  0  - 1 %   Basophils Absolute 0.1  0.0 - 0.1 K/uL  COMPREHENSIVE METABOLIC PANEL     Status: Abnormal   Collection Time    03/24/13  3:44 PM      Result Value Range   Sodium 136  135 - 145 mEq/L   Potassium 4.0  3.5 - 5.1 mEq/L   Chloride 96  96 - 112 mEq/L   CO2 22  19 - 32 mEq/L   Glucose, Bld 103 (*) 70 - 99 mg/dL   BUN 31 (*) 6 - 23 mg/dL   Creatinine, Ser 7.82 (*) 0.50 - 1.10 mg/dL   Calcium 95.6  8.4 - 21.3 mg/dL   Total Protein 8.9 (*) 6.0 - 8.3 g/dL   Albumin 4.1  3.5 - 5.2 g/dL   AST 23  0 - 37 U/L   ALT 17  0 - 35 U/L   Alkaline Phosphatase 78  39 - 117 U/L   Total Bilirubin 0.4  0.3 - 1.2 mg/dL  GFR calc non Af Amer 23 (*) >90 mL/min   GFR calc Af Amer 27 (*) >90 mL/min   Comment:            The eGFR has been calculated     using the CKD EPI equation.     This calculation has not been     validated in all clinical     situations.     eGFR's persistently     <90 mL/min signify     possible Chronic Kidney Disease.  CG4 I-STAT (LACTIC ACID)     Status: None   Collection Time    03/24/13  4:02 PM      Result Value Range   Lactic Acid, Venous 1.24  0.5 - 2.2 mmol/L  URINALYSIS, MICROSCOPIC ONLY     Status: Abnormal   Collection Time    03/24/13  6:55 PM      Result Value Range   Color, Urine YELLOW  YELLOW   APPearance CLOUDY (*) CLEAR   Specific Gravity, Urine 1.024  1.005 - 1.030   pH 5.5  5.0 - 8.0   Glucose, UA NEGATIVE  NEGATIVE mg/dL   Hgb urine dipstick NEGATIVE  NEGATIVE   Bilirubin Urine LARGE (*) NEGATIVE   Ketones, ur 15 (*) NEGATIVE mg/dL   Protein, ur 30 (*) NEGATIVE mg/dL   Urobilinogen, UA 1.0  0.0 - 1.0 mg/dL   Nitrite NEGATIVE  NEGATIVE   Leukocytes, UA SMALL (*) NEGATIVE   WBC, UA 3-6  <3 WBC/hpf   RBC / HPF 0-2  <3 RBC/hpf   Bacteria, UA MANY (*) RARE   Squamous Epithelial / LPF FEW (*) RARE   Casts HYALINE CASTS (*) NEGATIVE   Urine-Other AMORPHOUS URATES/PHOSPHATES    MRSA PCR SCREENING     Status: None   Collection Time    03/24/13  11:51 PM      Result Value Range   MRSA by PCR NEGATIVE  NEGATIVE   Comment:            The GeneXpert MRSA Assay (FDA     approved for NASAL specimens     only), is one component of a     comprehensive MRSA colonization     surveillance program. It is not     intended to diagnose MRSA     infection nor to guide or     monitor treatment for     MRSA infections.  BASIC METABOLIC PANEL     Status: Abnormal   Collection Time    03/25/13  4:30 AM      Result Value Range   Sodium 137  135 - 145 mEq/L   Potassium 4.0  3.5 - 5.1 mEq/L   Comment: HEMOLYSIS AT THIS LEVEL MAY AFFECT RESULT   Chloride 105  96 - 112 mEq/L   CO2 22  19 - 32 mEq/L   Glucose, Bld 103 (*) 70 - 99 mg/dL   BUN 24 (*) 6 - 23 mg/dL   Creatinine, Ser 1.61 (*) 0.50 - 1.10 mg/dL   Comment: DELTA CHECK NOTED   Calcium 8.1 (*) 8.4 - 10.5 mg/dL   GFR calc non Af Amer 47 (*) >90 mL/min   GFR calc Af Amer 54 (*) >90 mL/min   Comment:            The eGFR has been calculated     using the CKD EPI equation.     This calculation has not been  validated in all clinical     situations.     eGFR's persistently     <90 mL/min signify     possible Chronic Kidney Disease.  CBC     Status: Abnormal   Collection Time    03/25/13  4:30 AM      Result Value Range   WBC 7.3  4.0 - 10.5 K/uL   RBC 3.57 (*) 3.87 - 5.11 MIL/uL   Hemoglobin 9.7 (*) 12.0 - 15.0 g/dL   HCT 96.0 (*) 45.4 - 09.8 %   MCV 79.3  78.0 - 100.0 fL   MCH 27.2  26.0 - 34.0 pg   MCHC 34.3  30.0 - 36.0 g/dL   RDW 11.9  14.7 - 82.9 %   Platelets 301  150 - 400 K/uL   Comment: DELTA CHECK NOTED     REPEATED TO VERIFY  PROTIME-INR     Status: None   Collection Time    03/25/13  6:57 AM      Result Value Range   Prothrombin Time 14.4  11.6 - 15.2 seconds   INR 1.14  0.00 - 1.49  APTT     Status: None   Collection Time    03/25/13  6:57 AM      Result Value Range   aPTT 28  24 - 37 seconds   Ct Abdomen Pelvis W Contrast  03/23/2013  *RADIOLOGY  REPORT*  Clinical Data: Right-sided abdominal mass for the past 5 days. Redness and tenderness.  Status post hernia repair in 2011. Evaluate for abdominal wall abscess.  CT ABDOMEN AND PELVIS WITH CONTRAST  Technique:  Multidetector CT imaging of the abdomen and pelvis was performed following the standard protocol during bolus administration of intravenous contrast.  Contrast: OMNIPAQUE IOHEXOL 300 MG/ML  SOLN  Comparison: CT of the abdomen and pelvis 09/03/2010.  Findings:  Lung Bases: Several tiny 1-2 mm pulmonary nodules are seen scattered throughout the lungs bilaterally, and are highly nonspecific but favored to be benign.  Abdomen/Pelvis:  The appearance of the liver, gallbladder, pancreas, spleen, bilateral adrenal glands and bilateral kidneys is unremarkable.  Status post gastric bypass procedure.  A trace volume of ascites.  Numerous colonic diverticula, without surrounding inflammatory changes to suggest an acute diverticulitis at this time.  Pneumoperitoneum.  No pathologic distension of small bowel to suggest bowel obstruction at this time.  In the anterior aspect of the abdomen there is a thick-walled rim- enhancing fluid collection that measures approximately 3.5 x 8.1 x 8.9 cm which is intimately associated with the anterior abdominal wall musculature, as well as several loops of underlying small bowel.  Notably, the small bowel adjacent to this are well opacified with oral contrast material, but no definite oral contrast material extends into this collection.  On several images there is a suggestion of a potential fistulous tract from this collection through the anterior abdominal wall musculature into the overlying subcutaneous fat, however, this is not confirmed on this CT examination.  However, there is extensive soft tissue stranding throughout the subcutaneous fat, compatible with cellulitis. Additionally, there is a complex multilocular rim enhancing fluid collection in the subcutaneous  fat measuring approximately 5.4 x 2.5 x 5.5 cm, compatible with a subcutaneous abscess.  Musculoskeletal: There are no aggressive appearing lytic or blastic lesions noted in the visualized portions of the skeleton.  Anterior abdominal wall abscess, as above.  IMPRESSION: 1. 3.5 x 8.1 x 8.9 cm rim enhancing fluid collection in the anterior aspect  of the abdomen compatible with a large abscess. This is intimately associated with the anterior abdominal wall, and likely has an associated fistulous tract into the subcutaneous fat where there is an additional 5.4 x 2.5 x 5.5 cm abscess and surrounding cellulitis. 2.  Postoperative changes of gastric bypass. 3.  Colonic diverticulosis without findings to suggest acute diverticulitis at this time. 4.  Additional incidental findings, as above.   Original Report Authenticated By: Trudie Reed, M.D.     Review of Systems  Constitutional: Negative for fever.  Respiratory: Negative for shortness of breath.   Cardiovascular: Negative for chest pain.  Gastrointestinal: Positive for nausea and abdominal pain.  Neurological: Negative for dizziness.    Blood pressure 100/72, pulse 66, temperature 98.5 F (36.9 C), temperature source Oral, resp. rate 16, height 5\' 7"  (1.702 m), weight 258 lb 6.1 oz (117.2 kg), SpO2 99.00%. Physical Exam  Constitutional: She is oriented to person, place, and time.  Cardiovascular: Normal rate, regular rhythm and normal heart sounds.   No murmur heard. Respiratory: Effort normal and breath sounds normal. She has no wheezes.  GI: Soft. Bowel sounds are normal. She exhibits distension. There is tenderness.  Musculoskeletal: Normal range of motion.  Neurological: She is alert and oriented to person, place, and time.  Psychiatric: She has a normal mood and affect. Her behavior is normal. Judgment and thought content normal.     Assessment/Plan Intraabdominal abscess Previous ventral hernia repair 2011 Scheduled now for abscess  drain  Pt aware of procedure benefits and risks and agreeable to proceed Consent signed and in chart  Keishon Chavarin A 03/25/2013, 7:59 AM

## 2013-03-25 NOTE — ED Provider Notes (Signed)
I saw and evaluated the patient, reviewed the resident's note and I agree with the findings and plan. If applicable, I agree with the resident's interpretation of the EKG.  If applicable, I was present for critical portions of any procedures performed.  R sided abdominal pain x 5 days with outpatient CT showing large abdominal wall abscess. No fever.  Tachycardic with soft BP but alert and does not appear toxic. Fluctuant mass in RUQ. Improved HR and BP with fluids.  Surgery admit.  Glynn Octave, MD 03/25/13 1140

## 2013-03-25 NOTE — Progress Notes (Signed)
Patient transferred to 6N30.  Oriented to room and call light.  Patient placed on telemetry as ordered, Box [address]N31.  Ambulating in hall, gait is steady.  Drains to abdomen intact with serosanguinous drainage.  No complaints of pain.  Vital signs stable.  Will continue to monitor.

## 2013-03-25 NOTE — Progress Notes (Signed)
Utilization review completed.  

## 2013-03-25 NOTE — Progress Notes (Signed)
Back from radiology.  No complaints.

## 2013-03-25 NOTE — ED Notes (Signed)
o2 sats decreased to 84% RA.  O2 applied at 2l via [place]

## 2013-03-26 NOTE — Progress Notes (Signed)
Subjective: B abd drains placed 5/1 Pt feels great No pain  Objective: Vital signs in last 24 hours: Temp:  [98.3 F (36.8 C)-98.9 F (37.2 C)] 98.4 F (36.9 C) (05/02 0441) Pulse Rate:  [68-87] 73 (05/02 0441) Resp:  [9-22] 16 (05/02 0441) BP: (90-109)/(38-85) 93/47 mmHg (05/02 0441) SpO2:  [92 %-100 %] 100 % (05/02 0441) Last BM Date: 03/23/13  Intake/Output from previous day: 05/01 0701 - 05/02 0700 In: 1911.7 [I.V.:1401.7; IV Piggyback:500] Out: 630 [Urine:550; Drains:80] Intake/Output this shift:    PE:  afeb Up in chair Drains intact NT; sites clean and dry R output: blood tinged 15 cc yesterday 20 cc in bag L output: blood tinged 65 cc yesterday 40 cc in bag Cx: no growth  Lab Results:   Recent Labs  03/24/13 1544 03/25/13 0430  WBC 14.5* 7.3  HGB 11.4* 9.7*  HCT 33.3* 28.3*  PLT 435* 301   BMET  Recent Labs  03/24/13 1544 03/25/13 0430  NA 136 137  K 4.0 4.0  CL 96 105  CO2 22 22  GLUCOSE 103* 103*  BUN 31* 24*  CREATININE 2.30* 1.28*  CALCIUM 10.0 8.1*   PT/INR  Recent Labs  03/25/13 0657  LABPROT 14.4  INR 1.14   ABG No results found for this basename: PHART, PCO2, PO2, HCO3,  in the last 72 hours  Studies/Results: Ct Image Guided Fluid Drain By Catheter  03/25/2013  *RADIOLOGY REPORT*  Indication: Remote history of ventral hernia repair, now with anterior abdominal fluid collections concerning for abscess.  1.  CT GUIDED DRAINAGE CATHETER PLACEMENT INTO THE MIDLINE OF THE LOWER ABDOMEN 2.  CT GUIDED DRAINAGE CATHETER PLACEMENT INTO THE RIGHT LOWER ANTERIOR ABDOMINAL WALL  Comparison: CT abdomen pelvis - 03/23/2013  Medications: Fentanyl 200 mcg IV; Versed 4 mg IV  Total Moderate Sedation time: 50 minutes  Contrast: None  Complications: None immediate  Technique / Findings:  Informed written consent was obtained from the patient after a discussion of the risks, benefits and alternatives to treatment. The patient was placed supine  on the CT gantry and a pre procedural CT was performed re-demonstrating the known dominant abscess/fluid collection within the ventral aspect of the midline of the lower abdomen, measuring approximately 6.5 x 3.6 cm (image 12, series 3). There is an additional more superficial lobulated fluid collection within the adjacent aspect of the right anterior abdominal wall which measures approximately 5.5 x 2.3 cm (image 1, series three). The procedure was planned.   A timeout was performed prior to the initiation of the procedure.  Initially, the left lower abdominal quadrant was prepped and draped in the usual sterile fashion.   The overlying soft tissues were anesthetized with 1% lidocaine with epinephrine.  Appropriate trajectory was planned with the use of a 22 gauge spinal needle. An 18 gauge trocar needle was advanced into the dominant abscess/fluid collection and a short Amplatz super stiff wire was coiled within the collection.   Appropriate positioning was confirmed with a limited CT scan.  The tract was serially dilated allowing placement of a 10 [place] all-purpose drainage catheter. Appropriate positioning was confirmed with a limited postprocedural CT scan.  Approximately 45 ml of purulent fluid was aspirated.  The tube was connected to a drainage bag and sutured in place.  A dressing was placed.  Following initial drainage catheter placement, repeat imaging was performed and demonstrated the persistent fluid collection within the more cranial right anterior abdominal wall and as such, the decision was  made to place an additional drainage catheter.  The right lower abdominal quadrant was prepped and draped usual sterile fashion.  The overlying soft tissues were anesthetized with 1% lidocaine with epinephrine.  Appropriate trajectory was planned with the use of a 22 gauge spinal needle.  An 18 gauge trocar needle was advanced into the abscess/fluid collection and a short Amplatz super stiff wire was coiled  within the collection.   Appropriate positioning was confirmed with a limited CT scan.  The tract was serially dilated allowing placement of a 10 [place] all-purpose drainage catheter.  Appropriate positioning was confirmed with a limited postprocedural CT scan.  The patient tolerated the above procedures well without immediate post procedural complication.  Impression:  1.  Successful CT guided placement of a 10 [place] all purpose drain catheter into the ventral aspect of the lower abdomen via a left lower abdominal approach with aspiration of 45 mL of purulent fluid.  Samples were sent to the laboratory as requested by the ordering clinical team.  2.  Successful CT guided placement of a 10-[place] all-purpose drainage catheter into a separate lobulated fluid collection within the right anterior abdominal wall yielding approximately 25 ml of purulent fluid.  Samples were sent to the laboratory.   Original Report Authenticated By: Tacey Ruiz, MD    Ct Image Guided Fluid Drain By Catheter  03/25/2013  *RADIOLOGY REPORT*  Indication: Remote history of ventral hernia repair, now with anterior abdominal fluid collections concerning for abscess.  1.  CT GUIDED DRAINAGE CATHETER PLACEMENT INTO THE MIDLINE OF THE LOWER ABDOMEN 2.  CT GUIDED DRAINAGE CATHETER PLACEMENT INTO THE RIGHT LOWER ANTERIOR ABDOMINAL WALL  Comparison: CT abdomen pelvis - 03/23/2013  Medications: Fentanyl 200 mcg IV; Versed 4 mg IV  Total Moderate Sedation time: 50 minutes  Contrast: None  Complications: None immediate  Technique / Findings:  Informed written consent was obtained from the patient after a discussion of the risks, benefits and alternatives to treatment. The patient was placed supine on the CT gantry and a pre procedural CT was performed re-demonstrating the known dominant abscess/fluid collection within the ventral aspect of the midline of the lower abdomen, measuring approximately 6.5 x 3.6 cm (image 12, series 3). There is an  additional more superficial lobulated fluid collection within the adjacent aspect of the right anterior abdominal wall which measures approximately 5.5 x 2.3 cm (image 1, series three). The procedure was planned.   A timeout was performed prior to the initiation of the procedure.  Initially, the left lower abdominal quadrant was prepped and draped in the usual sterile fashion.   The overlying soft tissues were anesthetized with 1% lidocaine with epinephrine.  Appropriate trajectory was planned with the use of a 22 gauge spinal needle. An 18 gauge trocar needle was advanced into the dominant abscess/fluid collection and a short Amplatz super stiff wire was coiled within the collection.   Appropriate positioning was confirmed with a limited CT scan.  The tract was serially dilated allowing placement of a 10 [place] all-purpose drainage catheter. Appropriate positioning was confirmed with a limited postprocedural CT scan.  Approximately 45 ml of purulent fluid was aspirated.  The tube was connected to a drainage bag and sutured in place.  A dressing was placed.  Following initial drainage catheter placement, repeat imaging was performed and demonstrated the persistent fluid collection within the more cranial right anterior abdominal wall and as such, the decision was made to place an additional drainage catheter.  The right  lower abdominal quadrant was prepped and draped usual sterile fashion.  The overlying soft tissues were anesthetized with 1% lidocaine with epinephrine.  Appropriate trajectory was planned with the use of a 22 gauge spinal needle.  An 18 gauge trocar needle was advanced into the abscess/fluid collection and a short Amplatz super stiff wire was coiled within the collection.   Appropriate positioning was confirmed with a limited CT scan.  The tract was serially dilated allowing placement of a 10 [place] all-purpose drainage catheter.  Appropriate positioning was confirmed with a limited postprocedural  CT scan.  The patient tolerated the above procedures well without immediate post procedural complication.  Impression:  1.  Successful CT guided placement of a 10 [place] all purpose drain catheter into the ventral aspect of the lower abdomen via a left lower abdominal approach with aspiration of 45 mL of purulent fluid.  Samples were sent to the laboratory as requested by the ordering clinical team.  2.  Successful CT guided placement of a 10-French all-purpose drainage catheter into a separate lobulated fluid collection within the right anterior abdominal wall yielding approximately 25 ml of purulent fluid.  Samples were sent to the laboratory.   Original Report Authenticated By: Tacey Ruiz, MD     Anti-infectives:   Assessment/Plan: s/p  B abd drains placed 5/1 Better Will follow Plan per [facility]   LOS: 2 days    Chevis Weisensel A 03/26/2013

## 2013-03-26 NOTE — Progress Notes (Signed)
Patient ID: [name] Walker, female   DOB: Dec 07, 1955, 57 y.o.   MRN: [id number]    Subjective: No pain, feels hungry, waiting for breakfast this am  Objective: Vital signs in last 24 hours: Temp:  [98.3 F (36.8 C)-98.9 F (37.2 C)] 98.4 F (36.9 C) (05/02 0441) Pulse Rate:  [68-87] 73 (05/02 0441) Resp:  [9-22] 16 (05/02 0441) BP: (90-109)/(38-85) 93/47 mmHg (05/02 0441) SpO2:  [92 %-100 %] 100 % (05/02 0441) Last BM Date: 03/23/13  Intake/Output from previous day: 05/01 0701 - 05/02 0700 In: 1911.7 [I.V.:1401.7; IV Piggyback:500] Out: 630 [Urine:550; Drains:80] Intake/Output this shift:    General: NAD Abdomen soft with min fullness, no significant tenderness, no erythema, the drains have serosang fluid  Lab Results:   Recent Labs  03/24/13 1544 03/25/13 0430  WBC 14.5* 7.3  HGB 11.4* 9.7*  HCT 33.3* 28.3*  PLT 435* 301   BMET  Recent Labs  03/24/13 1544 03/25/13 0430  NA 136 137  K 4.0 4.0  CL 96 105  CO2 22 22  GLUCOSE 103* 103*  BUN 31* 24*  CREATININE 2.30* 1.28*  CALCIUM 10.0 8.1*   PT/INR  Recent Labs  03/25/13 0657  LABPROT 14.4  INR 1.14   ABG No results found for this basename: PHART, PCO2, PO2, HCO3,  in the last 72 hours  Studies/Results: Ct Image Guided Fluid Drain By Catheter  03/25/2013  *RADIOLOGY REPORT*  Indication: Remote history of ventral hernia repair, now with anterior abdominal fluid collections concerning for abscess.  1.  CT GUIDED DRAINAGE CATHETER PLACEMENT INTO THE MIDLINE OF THE LOWER ABDOMEN 2.  CT GUIDED DRAINAGE CATHETER PLACEMENT INTO THE RIGHT LOWER ANTERIOR ABDOMINAL WALL  Comparison: CT abdomen pelvis - 03/23/2013  Medications: Fentanyl 200 mcg IV; Versed 4 mg IV  Total Moderate Sedation time: 50 minutes  Contrast: None  Complications: None immediate  Technique / Findings:  Informed written consent was obtained from the patient after a discussion of the risks, benefits and alternatives to treatment. The patient was  placed supine on the CT gantry and a pre procedural CT was performed re-demonstrating the known dominant abscess/fluid collection within the ventral aspect of the midline of the lower abdomen, measuring approximately 6.5 x 3.6 cm (image 12, series 3). There is an additional more superficial lobulated fluid collection within the adjacent aspect of the right anterior abdominal wall which measures approximately 5.5 x 2.3 cm (image 1, series three). The procedure was planned.   A timeout was performed prior to the initiation of the procedure.  Initially, the left lower abdominal quadrant was prepped and draped in the usual sterile fashion.   The overlying soft tissues were anesthetized with 1% lidocaine with epinephrine.  Appropriate trajectory was planned with the use of a 22 gauge spinal needle. An 18 gauge trocar needle was advanced into the dominant abscess/fluid collection and a short Amplatz super stiff wire was coiled within the collection.   Appropriate positioning was confirmed with a limited CT scan.  The tract was serially dilated allowing placement of a 10 [place] all-purpose drainage catheter. Appropriate positioning was confirmed with a limited postprocedural CT scan.  Approximately 45 ml of purulent fluid was aspirated.  The tube was connected to a drainage bag and sutured in place.  A dressing was placed.  Following initial drainage catheter placement, repeat imaging was performed and demonstrated the persistent fluid collection within the more cranial right anterior abdominal wall and as such, the decision was made to place an  additional drainage catheter.  The right lower abdominal quadrant was prepped and draped usual sterile fashion.  The overlying soft tissues were anesthetized with 1% lidocaine with epinephrine.  Appropriate trajectory was planned with the use of a 22 gauge spinal needle.  An 18 gauge trocar needle was advanced into the abscess/fluid collection and a short Amplatz super stiff wire  was coiled within the collection.   Appropriate positioning was confirmed with a limited CT scan.  The tract was serially dilated allowing placement of a 10 [place] all-purpose drainage catheter.  Appropriate positioning was confirmed with a limited postprocedural CT scan.  The patient tolerated the above procedures well without immediate post procedural complication.  Impression:  1.  Successful CT guided placement of a 10 [place] all purpose drain catheter into the ventral aspect of the lower abdomen via a left lower abdominal approach with aspiration of 45 mL of purulent fluid.  Samples were sent to the laboratory as requested by the ordering clinical team.  2.  Successful CT guided placement of a 10-French all-purpose drainage catheter into a separate lobulated fluid collection within the right anterior abdominal wall yielding approximately 25 ml of purulent fluid.  Samples were sent to the laboratory.   Original Report Authenticated By: Tacey Ruiz, MD    Ct Image Guided Fluid Drain By Catheter  03/25/2013  *RADIOLOGY REPORT*  Indication: Remote history of ventral hernia repair, now with anterior abdominal fluid collections concerning for abscess.  1.  CT GUIDED DRAINAGE CATHETER PLACEMENT INTO THE MIDLINE OF THE LOWER ABDOMEN 2.  CT GUIDED DRAINAGE CATHETER PLACEMENT INTO THE RIGHT LOWER ANTERIOR ABDOMINAL WALL  Comparison: CT abdomen pelvis - 03/23/2013  Medications: Fentanyl 200 mcg IV; Versed 4 mg IV  Total Moderate Sedation time: 50 minutes  Contrast: None  Complications: None immediate  Technique / Findings:  Informed written consent was obtained from the patient after a discussion of the risks, benefits and alternatives to treatment. The patient was placed supine on the CT gantry and a pre procedural CT was performed re-demonstrating the known dominant abscess/fluid collection within the ventral aspect of the midline of the lower abdomen, measuring approximately 6.5 x 3.6 cm (image 12, series 3). There  is an additional more superficial lobulated fluid collection within the adjacent aspect of the right anterior abdominal wall which measures approximately 5.5 x 2.3 cm (image 1, series three). The procedure was planned.   A timeout was performed prior to the initiation of the procedure.  Initially, the left lower abdominal quadrant was prepped and draped in the usual sterile fashion.   The overlying soft tissues were anesthetized with 1% lidocaine with epinephrine.  Appropriate trajectory was planned with the use of a 22 gauge spinal needle. An 18 gauge trocar needle was advanced into the dominant abscess/fluid collection and a short Amplatz super stiff wire was coiled within the collection.   Appropriate positioning was confirmed with a limited CT scan.  The tract was serially dilated allowing placement of a 10 [place] all-purpose drainage catheter. Appropriate positioning was confirmed with a limited postprocedural CT scan.  Approximately 45 ml of purulent fluid was aspirated.  The tube was connected to a drainage bag and sutured in place.  A dressing was placed.  Following initial drainage catheter placement, repeat imaging was performed and demonstrated the persistent fluid collection within the more cranial right anterior abdominal wall and as such, the decision was made to place an additional drainage catheter.  The right lower abdominal quadrant was  prepped and draped usual sterile fashion.  The overlying soft tissues were anesthetized with 1% lidocaine with epinephrine.  Appropriate trajectory was planned with the use of a 22 gauge spinal needle.  An 18 gauge trocar needle was advanced into the abscess/fluid collection and a short Amplatz super stiff wire was coiled within the collection.   Appropriate positioning was confirmed with a limited CT scan.  The tract was serially dilated allowing placement of a 10 [place] all-purpose drainage catheter.  Appropriate positioning was confirmed with a limited  postprocedural CT scan.  The patient tolerated the above procedures well without immediate post procedural complication.  Impression:  1.  Successful CT guided placement of a 10 [place] all purpose drain catheter into the ventral aspect of the lower abdomen via a left lower abdominal approach with aspiration of 45 mL of purulent fluid.  Samples were sent to the laboratory as requested by the ordering clinical team.  2.  Successful CT guided placement of a 10-French all-purpose drainage catheter into a separate lobulated fluid collection within the right anterior abdominal wall yielding approximately 25 ml of purulent fluid.  Samples were sent to the laboratory.   Original Report Authenticated By: Tacey Ruiz, MD     Anti-infectives: Anti-infectives   Start     Dose/Rate Route Frequency Ordered Stop   03/25/13 2200  vancomycin (VANCOCIN) 1,500 mg in sodium chloride 0.9 % 500 mL IVPB  Status:  Discontinued     1,500 mg 250 mL/hr over 120 Minutes Intravenous Every 24 hours 03/24/13 2006 03/25/13 0945   03/25/13 1000  vancomycin (VANCOCIN) IVPB 1000 mg/200 mL premix     1,000 mg 200 mL/hr over 60 Minutes Intravenous Every 12 hours 03/25/13 0945     03/25/13 0600  piperacillin-tazobactam (ZOSYN) IVPB 3.375 g     3.375 g 12.5 mL/hr over 240 Minutes Intravenous 3 times per day 03/24/13 2006     03/24/13 2100  vancomycin (VANCOCIN) 1,500 mg in sodium chloride 0.9 % 500 mL IVPB     1,500 mg 250 mL/hr over 120 Minutes Intravenous  Once 03/24/13 2005 03/24/13 2301   03/24/13 2015  piperacillin-tazobactam (ZOSYN) IVPB 3.375 g     3.375 g 100 mL/hr over 30 Minutes Intravenous  Once 03/24/13 2000 03/24/13 2047      Assessment/Plan: S/P 2 Percutaneous drains: Suspected subcutaneous abscess than may or may not communicate with chronic seroma around mesh.  Cultures pending.  On zosyn and vancomycin for now.  Start diet today.  LOS: 2 days    Elaura Calix 03/26/2013

## 2013-03-26 NOTE — Progress Notes (Signed)
I have seen and examined the patient and agree with the assessment and plans.  Arlene Genova A. Anaiah Mcmannis  MD, FACS  

## 2013-03-27 DIAGNOSIS — IMO0002 Reserved for concepts with insufficient information to code with codable children: Secondary | ICD-10-CM | POA: Diagnosis present

## 2013-03-27 LAB — URINE CULTURE

## 2013-03-27 LAB — VANCOMYCIN, TROUGH: Vancomycin Tr: 19.4 ug/mL (ref 10.0–20.0)

## 2013-03-27 MED ORDER — PANTOPRAZOLE SODIUM 40 MG PO TBEC
40.0000 mg | DELAYED_RELEASE_TABLET | Freq: Every day | ORAL | Status: DC
  Administered 2013-03-27 – 2013-03-29 (×3): 40 mg via ORAL
  Filled 2013-03-27 (×3): qty 1

## 2013-03-27 NOTE — Progress Notes (Signed)
ANTIBIOTIC CONSULT NOTE - FOLLOW UP  Pharmacy Consult for Vancomycin Indication: Large intra-abdominal abscess  Allergies  Allergen Reactions  . Tramadol Nausea And Vomiting    Patient Measurements: Height: 5\' 7"  (170.2 cm) Weight: 258 lb 6.1 oz (117.2 kg) IBW/kg (Calculated) : 61.6 Adjusted Body Weight:   Vital Signs: Temp: 98.4 F (36.9 C) (05/03 1400) Temp src: Oral (05/03 1400) BP: 111/81 mmHg (05/03 1400) Pulse Rate: 78 (05/03 1400) Intake/Output from previous day: 05/02 0701 - 05/03 0700 In: 610 [P.O.:600] Out: 210 [Urine:150; Drains:60] Intake/Output from this shift:    Labs:  Recent Labs  03/25/13 0430  WBC 7.3  HGB 9.7*  PLT 301  CREATININE 1.28*   Estimated Creatinine Clearance: 66.5 ml/min (by C-G formula based on Cr of 1.28).  Recent Labs  03/27/13 2105  VANCOTROUGH 19.4     Microbiology: Recent Results (from the past 720 hour(s))  URINE CULTURE     Status: None   Collection Time    03/24/13  6:55 PM      Result Value Range Status   Specimen Description URINE, CLEAN CATCH   Final   Special Requests NONE   Final   Culture  Setup Time 03/24/2013 21:03   Final   Colony Count 70,000 COLONIES/ML   Final   Culture ESCHERICHIA COLI   Final   Report Status 03/27/2013 FINAL   Final   Organism ID, Bacteria ESCHERICHIA COLI   Final  MRSA PCR SCREENING     Status: None   Collection Time    03/24/13 11:51 PM      Result Value Range Status   MRSA by PCR NEGATIVE  NEGATIVE Final   Comment:            The GeneXpert MRSA Assay (FDA     approved for NASAL specimens     only), is one component of a     comprehensive MRSA colonization     surveillance program. It is not     intended to diagnose MRSA     infection nor to guide or     monitor treatment for     MRSA infections.  CULTURE, ROUTINE-ABSCESS     Status: None   Collection Time    03/25/13  3:16 PM      Result Value Range Status   Specimen Description ABSCESS ABDOMEN LEFT   Final   Special Requests NONE   Final   Gram Stain     Final   Value: ABUNDANT WBC PRESENT,BOTH PMN AND MONONUCLEAR     NO SQUAMOUS EPITHELIAL CELLS SEEN     MODERATE GRAM POSITIVE COCCI     IN PAIRS IN CLUSTERS   Culture     Final   Value: ABUNDANT STAPHYLOCOCCUS AUREUS     Note: RIFAMPIN AND GENTAMICIN SHOULD NOT BE USED AS SINGLE DRUGS FOR TREATMENT OF STAPH INFECTIONS.   Report Status PENDING   Incomplete  CULTURE, ROUTINE-ABSCESS     Status: None   Collection Time    03/25/13  3:29 PM      Result Value Range Status   Specimen Description ABSCESS ABDOMEN RIGHT   Final   Special Requests NONE   Final   Gram Stain     Final   Value: ABUNDANT WBC PRESENT,BOTH PMN AND MONONUCLEAR     NO SQUAMOUS EPITHELIAL CELLS SEEN     FEW GRAM POSITIVE COCCI     IN PAIRS IN CLUSTERS   Culture     Final  Value: MODERATE STAPHYLOCOCCUS AUREUS     Note: RIFAMPIN AND GENTAMICIN SHOULD NOT BE USED AS SINGLE DRUGS FOR TREATMENT OF STAPH INFECTIONS.   Report Status PENDING   Incomplete    Anti-infectives   Start     Dose/Rate Route Frequency Ordered Stop   03/25/13 2200  vancomycin (VANCOCIN) 1,500 mg in sodium chloride 0.9 % 500 mL IVPB  Status:  Discontinued     1,500 mg 250 mL/hr over 120 Minutes Intravenous Every 24 hours 03/24/13 2006 03/25/13 0945   03/25/13 1000  vancomycin (VANCOCIN) IVPB 1000 mg/200 mL premix     1,000 mg 200 mL/hr over 60 Minutes Intravenous Every 12 hours 03/25/13 0945     03/25/13 0600  piperacillin-tazobactam (ZOSYN) IVPB 3.375 g     3.375 g 12.5 mL/hr over 240 Minutes Intravenous 3 times per day 03/24/13 2006     03/24/13 2100  vancomycin (VANCOCIN) 1,500 mg in sodium chloride 0.9 % 500 mL IVPB     1,500 mg 250 mL/hr over 120 Minutes Intravenous  Once 03/24/13 2005 03/24/13 2301   03/24/13 2015  piperacillin-tazobactam (ZOSYN) IVPB 3.375 g     3.375 g 100 mL/hr over 30 Minutes Intravenous  Once 03/24/13 2000 03/24/13 2047      Assessment: [name] Walker is a very  pleasant 57 yo WF to start vanc and zosyn for a large intraabdominal abscess. She had a ventral hernia repaired emergently in 2011. She has a large intraabdominal abscess by CT.   Infectious Disease: Vanc/Zosyn D#4 for intra-abdominal abscess - now growing abundant SA in 2/2 abscess cx. S/p perc drain placed 5/1. Afebrile. Wbc down to nl.   vanc 4/30>>  5/3 Vanco trough 19.4 Zosyn 4/30>>  5/1 Abscess x2 >>Abundant SA 4/30 Urine>>Ecoli 70kcol (pan sensitive)  Goal of Therapy:  Vancomycin trough level 15-20 mcg/ml  Plan:  Continue Vancomycin 1g IV q12h hrs.  [name] Walker 03/27/2013,10:03 PM

## 2013-03-27 NOTE — Progress Notes (Signed)
Patient ID: [name] Walker, female   DOB: 03-03-1956, 57 y.o.   MRN: [id number]    Subjective: Doing ok, no significant pain  Objective: Vital signs in last 24 hours: Temp:  [97.8 F (36.6 C)-98.4 F (36.9 C)] 97.8 F (36.6 C) (05/03 0635) Pulse Rate:  [76-86] 76 (05/03 0635) Resp:  [18-20] 18 (05/03 0635) BP: (96-133)/(40-90) 111/55 mmHg (05/03 0635) SpO2:  [97 %-100 %] 97 % (05/03 0635) Last BM Date: 03/23/13  Intake/Output from previous day: 05/02 0701 - 05/03 0700 In: 610 [P.O.:600] Out: 210 [Urine:150; Drains:60] Intake/Output this shift:    General: NAD Abdomen soft with min fullness, no significant tenderness, no erythema, the drains have serosang fluid  Lab Results:   Recent Labs  03/24/13 1544 03/25/13 0430  WBC 14.5* 7.3  HGB 11.4* 9.7*  HCT 33.3* 28.3*  PLT 435* 301   BMET  Recent Labs  03/24/13 1544 03/25/13 0430  NA 136 137  K 4.0 4.0  CL 96 105  CO2 22 22  GLUCOSE 103* 103*  BUN 31* 24*  CREATININE 2.30* 1.28*  CALCIUM 10.0 8.1*   PT/INR  Recent Labs  03/25/13 0657  LABPROT 14.4  INR 1.14   ABG No results found for this basename: PHART, PCO2, PO2, HCO3,  in the last 72 hours  Studies/Results: Ct Image Guided Fluid Drain By Catheter  03/25/2013  *RADIOLOGY REPORT*  Indication: Remote history of ventral hernia repair, now with anterior abdominal fluid collections concerning for abscess.  1.  CT GUIDED DRAINAGE CATHETER PLACEMENT INTO THE MIDLINE OF THE LOWER ABDOMEN 2.  CT GUIDED DRAINAGE CATHETER PLACEMENT INTO THE RIGHT LOWER ANTERIOR ABDOMINAL WALL  Comparison: CT abdomen pelvis - 03/23/2013  Medications: Fentanyl 200 mcg IV; Versed 4 mg IV  Total Moderate Sedation time: 50 minutes  Contrast: None  Complications: None immediate  Technique / Findings:  Informed written consent was obtained from the patient after a discussion of the risks, benefits and alternatives to treatment. The patient was placed supine on the CT gantry and a pre  procedural CT was performed re-demonstrating the known dominant abscess/fluid collection within the ventral aspect of the midline of the lower abdomen, measuring approximately 6.5 x 3.6 cm (image 12, series 3). There is an additional more superficial lobulated fluid collection within the adjacent aspect of the right anterior abdominal wall which measures approximately 5.5 x 2.3 cm (image 1, series three). The procedure was planned.   A timeout was performed prior to the initiation of the procedure.  Initially, the left lower abdominal quadrant was prepped and draped in the usual sterile fashion.   The overlying soft tissues were anesthetized with 1% lidocaine with epinephrine.  Appropriate trajectory was planned with the use of a 22 gauge spinal needle. An 18 gauge trocar needle was advanced into the dominant abscess/fluid collection and a short Amplatz super stiff wire was coiled within the collection.   Appropriate positioning was confirmed with a limited CT scan.  The tract was serially dilated allowing placement of a 10 [place] all-purpose drainage catheter. Appropriate positioning was confirmed with a limited postprocedural CT scan.  Approximately 45 ml of purulent fluid was aspirated.  The tube was connected to a drainage bag and sutured in place.  A dressing was placed.  Following initial drainage catheter placement, repeat imaging was performed and demonstrated the persistent fluid collection within the more cranial right anterior abdominal wall and as such, the decision was made to place an additional drainage catheter.  The right  lower abdominal quadrant was prepped and draped usual sterile fashion.  The overlying soft tissues were anesthetized with 1% lidocaine with epinephrine.  Appropriate trajectory was planned with the use of a 22 gauge spinal needle.  An 18 gauge trocar needle was advanced into the abscess/fluid collection and a short Amplatz super stiff wire was coiled within the collection.    Appropriate positioning was confirmed with a limited CT scan.  The tract was serially dilated allowing placement of a 10 [place] all-purpose drainage catheter.  Appropriate positioning was confirmed with a limited postprocedural CT scan.  The patient tolerated the above procedures well without immediate post procedural complication.  Impression:  1.  Successful CT guided placement of a 10 [place] all purpose drain catheter into the ventral aspect of the lower abdomen via a left lower abdominal approach with aspiration of 45 mL of purulent fluid.  Samples were sent to the laboratory as requested by the ordering clinical team.  2.  Successful CT guided placement of a 10-French all-purpose drainage catheter into a separate lobulated fluid collection within the right anterior abdominal wall yielding approximately 25 ml of purulent fluid.  Samples were sent to the laboratory.   Original Report Authenticated By: Tacey Ruiz, MD    Ct Image Guided Fluid Drain By Catheter  03/25/2013  *RADIOLOGY REPORT*  Indication: Remote history of ventral hernia repair, now with anterior abdominal fluid collections concerning for abscess.  1.  CT GUIDED DRAINAGE CATHETER PLACEMENT INTO THE MIDLINE OF THE LOWER ABDOMEN 2.  CT GUIDED DRAINAGE CATHETER PLACEMENT INTO THE RIGHT LOWER ANTERIOR ABDOMINAL WALL  Comparison: CT abdomen pelvis - 03/23/2013  Medications: Fentanyl 200 mcg IV; Versed 4 mg IV  Total Moderate Sedation time: 50 minutes  Contrast: None  Complications: None immediate  Technique / Findings:  Informed written consent was obtained from the patient after a discussion of the risks, benefits and alternatives to treatment. The patient was placed supine on the CT gantry and a pre procedural CT was performed re-demonstrating the known dominant abscess/fluid collection within the ventral aspect of the midline of the lower abdomen, measuring approximately 6.5 x 3.6 cm (image 12, series 3). There is an additional more superficial  lobulated fluid collection within the adjacent aspect of the right anterior abdominal wall which measures approximately 5.5 x 2.3 cm (image 1, series three). The procedure was planned.   A timeout was performed prior to the initiation of the procedure.  Initially, the left lower abdominal quadrant was prepped and draped in the usual sterile fashion.   The overlying soft tissues were anesthetized with 1% lidocaine with epinephrine.  Appropriate trajectory was planned with the use of a 22 gauge spinal needle. An 18 gauge trocar needle was advanced into the dominant abscess/fluid collection and a short Amplatz super stiff wire was coiled within the collection.   Appropriate positioning was confirmed with a limited CT scan.  The tract was serially dilated allowing placement of a 10 [place] all-purpose drainage catheter. Appropriate positioning was confirmed with a limited postprocedural CT scan.  Approximately 45 ml of purulent fluid was aspirated.  The tube was connected to a drainage bag and sutured in place.  A dressing was placed.  Following initial drainage catheter placement, repeat imaging was performed and demonstrated the persistent fluid collection within the more cranial right anterior abdominal wall and as such, the decision was made to place an additional drainage catheter.  The right lower abdominal quadrant was prepped and draped usual sterile fashion.  The overlying soft tissues were anesthetized with 1% lidocaine with epinephrine.  Appropriate trajectory was planned with the use of a 22 gauge spinal needle.  An 18 gauge trocar needle was advanced into the abscess/fluid collection and a short Amplatz super stiff wire was coiled within the collection.   Appropriate positioning was confirmed with a limited CT scan.  The tract was serially dilated allowing placement of a 10 [place] all-purpose drainage catheter.  Appropriate positioning was confirmed with a limited postprocedural CT scan.  The patient  tolerated the above procedures well without immediate post procedural complication.  Impression:  1.  Successful CT guided placement of a 10 [place] all purpose drain catheter into the ventral aspect of the lower abdomen via a left lower abdominal approach with aspiration of 45 mL of purulent fluid.  Samples were sent to the laboratory as requested by the ordering clinical team.  2.  Successful CT guided placement of a 10-French all-purpose drainage catheter into a separate lobulated fluid collection within the right anterior abdominal wall yielding approximately 25 ml of purulent fluid.  Samples were sent to the laboratory.   Original Report Authenticated By: Tacey Ruiz, MD     Anti-infectives: Anti-infectives   Start     Dose/Rate Route Frequency Ordered Stop   03/25/13 2200  vancomycin (VANCOCIN) 1,500 mg in sodium chloride 0.9 % 500 mL IVPB  Status:  Discontinued     1,500 mg 250 mL/hr over 120 Minutes Intravenous Every 24 hours 03/24/13 2006 03/25/13 0945   03/25/13 1000  vancomycin (VANCOCIN) IVPB 1000 mg/200 mL premix     1,000 mg 200 mL/hr over 60 Minutes Intravenous Every 12 hours 03/25/13 0945     03/25/13 0600  piperacillin-tazobactam (ZOSYN) IVPB 3.375 g     3.375 g 12.5 mL/hr over 240 Minutes Intravenous 3 times per day 03/24/13 2006     03/24/13 2100  vancomycin (VANCOCIN) 1,500 mg in sodium chloride 0.9 % 500 mL IVPB     1,500 mg 250 mL/hr over 120 Minutes Intravenous  Once 03/24/13 2005 03/24/13 2301   03/24/13 2015  piperacillin-tazobactam (ZOSYN) IVPB 3.375 g     3.375 g 100 mL/hr over 30 Minutes Intravenous  Once 03/24/13 2000 03/24/13 2047      Assessment/Plan: S/P 2 Percutaneous drains: Suspected subcutaneous abscess than may or may not communicate with chronic seroma around mesh.  Cultures growing gram positive cocci in pairs so far.  On zosyn and vancomycin for now.   LOS: 3 days    Ewell Benassi 03/27/2013

## 2013-03-27 NOTE — Progress Notes (Signed)
Agree with A&P of EW,PA. Benign abdomen, drainage not really purulent

## 2013-03-27 NOTE — Progress Notes (Signed)
Subjective: RUQ abscess drains placed 5/1 Doing well Up in room   Objective: Vital signs in last 24 hours: Temp:  [97.8 F (36.6 C)-98.4 F (36.9 C)] 97.8 F (36.6 C) (05/03 0635) Pulse Rate:  [76-86] 76 (05/03 0635) Resp:  [18-20] 18 (05/03 0635) BP: (96-133)/(40-90) 111/55 mmHg (05/03 0635) SpO2:  [97 %-100 %] 97 % (05/03 0635) Last BM Date: 03/23/13  Intake/Output from previous day: 05/02 0701 - 05/03 0700 In: 610 [P.O.:600] Out: 210 [Urine:150; Drains:60] Intake/Output this shift:    PE: afeb; VSS Output: L  45cc yesterday              R 15 cc yesterday Cx: g+ cocci in pairs Site clean and dry; NT   Lab Results:   Recent Labs  03/24/13 1544 03/25/13 0430  WBC 14.5* 7.3  HGB 11.4* 9.7*  HCT 33.3* 28.3*  PLT 435* 301   BMET  Recent Labs  03/24/13 1544 03/25/13 0430  NA 136 137  K 4.0 4.0  CL 96 105  CO2 22 22  GLUCOSE 103* 103*  BUN 31* 24*  CREATININE 2.30* 1.28*  CALCIUM 10.0 8.1*   PT/INR  Recent Labs  03/25/13 0657  LABPROT 14.4  INR 1.14   ABG No results found for this basename: PHART, PCO2, PO2, HCO3,  in the last 72 hours  Studies/Results: Ct Image Guided Fluid Drain By Catheter  03/25/2013  *RADIOLOGY REPORT*  Indication: Remote history of ventral hernia repair, now with anterior abdominal fluid collections concerning for abscess.  1.  CT GUIDED DRAINAGE CATHETER PLACEMENT INTO THE MIDLINE OF THE LOWER ABDOMEN 2.  CT GUIDED DRAINAGE CATHETER PLACEMENT INTO THE RIGHT LOWER ANTERIOR ABDOMINAL WALL  Comparison: CT abdomen pelvis - 03/23/2013  Medications: Fentanyl 200 mcg IV; Versed 4 mg IV  Total Moderate Sedation time: 50 minutes  Contrast: None  Complications: None immediate  Technique / Findings:  Informed written consent was obtained from the patient after a discussion of the risks, benefits and alternatives to treatment. The patient was placed supine on the CT gantry and a pre procedural CT was performed re-demonstrating the known  dominant abscess/fluid collection within the ventral aspect of the midline of the lower abdomen, measuring approximately 6.5 x 3.6 cm (image 12, series 3). There is an additional more superficial lobulated fluid collection within the adjacent aspect of the right anterior abdominal wall which measures approximately 5.5 x 2.3 cm (image 1, series three). The procedure was planned.   A timeout was performed prior to the initiation of the procedure.  Initially, the left lower abdominal quadrant was prepped and draped in the usual sterile fashion.   The overlying soft tissues were anesthetized with 1% lidocaine with epinephrine.  Appropriate trajectory was planned with the use of a 22 gauge spinal needle. An 18 gauge trocar needle was advanced into the dominant abscess/fluid collection and a short Amplatz super stiff wire was coiled within the collection.   Appropriate positioning was confirmed with a limited CT scan.  The tract was serially dilated allowing placement of a 10 [place] all-purpose drainage catheter. Appropriate positioning was confirmed with a limited postprocedural CT scan.  Approximately 45 ml of purulent fluid was aspirated.  The tube was connected to a drainage bag and sutured in place.  A dressing was placed.  Following initial drainage catheter placement, repeat imaging was performed and demonstrated the persistent fluid collection within the more cranial right anterior abdominal wall and as such, the decision was made to place  an additional drainage catheter.  The right lower abdominal quadrant was prepped and draped usual sterile fashion.  The overlying soft tissues were anesthetized with 1% lidocaine with epinephrine.  Appropriate trajectory was planned with the use of a 22 gauge spinal needle.  An 18 gauge trocar needle was advanced into the abscess/fluid collection and a short Amplatz super stiff wire was coiled within the collection.   Appropriate positioning was confirmed with a limited CT scan.   The tract was serially dilated allowing placement of a 10 [place] all-purpose drainage catheter.  Appropriate positioning was confirmed with a limited postprocedural CT scan.  The patient tolerated the above procedures well without immediate post procedural complication.  Impression:  1.  Successful CT guided placement of a 10 [place] all purpose drain catheter into the ventral aspect of the lower abdomen via a left lower abdominal approach with aspiration of 45 mL of purulent fluid.  Samples were sent to the laboratory as requested by the ordering clinical team.  2.  Successful CT guided placement of a 10-French all-purpose drainage catheter into a separate lobulated fluid collection within the right anterior abdominal wall yielding approximately 25 ml of purulent fluid.  Samples were sent to the laboratory.   Original Report Authenticated By: Tacey Ruiz, MD    Ct Image Guided Fluid Drain By Catheter  03/25/2013  *RADIOLOGY REPORT*  Indication: Remote history of ventral hernia repair, now with anterior abdominal fluid collections concerning for abscess.  1.  CT GUIDED DRAINAGE CATHETER PLACEMENT INTO THE MIDLINE OF THE LOWER ABDOMEN 2.  CT GUIDED DRAINAGE CATHETER PLACEMENT INTO THE RIGHT LOWER ANTERIOR ABDOMINAL WALL  Comparison: CT abdomen pelvis - 03/23/2013  Medications: Fentanyl 200 mcg IV; Versed 4 mg IV  Total Moderate Sedation time: 50 minutes  Contrast: None  Complications: None immediate  Technique / Findings:  Informed written consent was obtained from the patient after a discussion of the risks, benefits and alternatives to treatment. The patient was placed supine on the CT gantry and a pre procedural CT was performed re-demonstrating the known dominant abscess/fluid collection within the ventral aspect of the midline of the lower abdomen, measuring approximately 6.5 x 3.6 cm (image 12, series 3). There is an additional more superficial lobulated fluid collection within the adjacent aspect of the  right anterior abdominal wall which measures approximately 5.5 x 2.3 cm (image 1, series three). The procedure was planned.   A timeout was performed prior to the initiation of the procedure.  Initially, the left lower abdominal quadrant was prepped and draped in the usual sterile fashion.   The overlying soft tissues were anesthetized with 1% lidocaine with epinephrine.  Appropriate trajectory was planned with the use of a 22 gauge spinal needle. An 18 gauge trocar needle was advanced into the dominant abscess/fluid collection and a short Amplatz super stiff wire was coiled within the collection.   Appropriate positioning was confirmed with a limited CT scan.  The tract was serially dilated allowing placement of a 10 [place] all-purpose drainage catheter. Appropriate positioning was confirmed with a limited postprocedural CT scan.  Approximately 45 ml of purulent fluid was aspirated.  The tube was connected to a drainage bag and sutured in place.  A dressing was placed.  Following initial drainage catheter placement, repeat imaging was performed and demonstrated the persistent fluid collection within the more cranial right anterior abdominal wall and as such, the decision was made to place an additional drainage catheter.  The right lower abdominal quadrant  was prepped and draped usual sterile fashion.  The overlying soft tissues were anesthetized with 1% lidocaine with epinephrine.  Appropriate trajectory was planned with the use of a 22 gauge spinal needle.  An 18 gauge trocar needle was advanced into the abscess/fluid collection and a short Amplatz super stiff wire was coiled within the collection.   Appropriate positioning was confirmed with a limited CT scan.  The tract was serially dilated allowing placement of a 10 [place] all-purpose drainage catheter.  Appropriate positioning was confirmed with a limited postprocedural CT scan.  The patient tolerated the above procedures well without immediate post  procedural complication.  Impression:  1.  Successful CT guided placement of a 10 [place] all purpose drain catheter into the ventral aspect of the lower abdomen via a left lower abdominal approach with aspiration of 45 mL of purulent fluid.  Samples were sent to the laboratory as requested by the ordering clinical team.  2.  Successful CT guided placement of a 10-French all-purpose drainage catheter into a separate lobulated fluid collection within the right anterior abdominal wall yielding approximately 25 ml of purulent fluid.  Samples were sent to the laboratory.   Original Report Authenticated By: Tacey Ruiz, MD     Anti-infectives:   Assessment/Plan: s/p * No surgery found *  Abd abscess drains intact Output still significant Plan per [facility]   LOS: 3 days    Jamontae Thwaites A 03/27/2013

## 2013-03-27 NOTE — Progress Notes (Signed)
ANTIBIOTIC CONSULT NOTE - Follow-Up  Pharmacy Consult for vancomycin and zosyn Indication: large intraabdominal abscess  Allergies  Allergen Reactions  . Tramadol Nausea And Vomiting    Patient Measurements: Height: 5\' 7"  (170.2 cm) Weight: 258 lb 6.1 oz (117.2 kg) IBW/kg (Calculated) : 61.6  Vital Signs: Temp: 97.8 F (36.6 C) (05/03 0635) Temp src: Oral (05/03 0635) BP: 111/55 mmHg (05/03 0635) Pulse Rate: 76 (05/03 0635) Intake/Output from previous day: 05/02 0701 - 05/03 0700 In: 610 [P.O.:600] Out: 210 [Urine:150; Drains:60] Intake/Output from this shift:    Labs:  Recent Labs  03/24/13 1544 03/25/13 0430  WBC 14.5* 7.3  HGB 11.4* 9.7*  PLT 435* 301  CREATININE 2.30* 1.28*   Estimated Creatinine Clearance: 66.5 ml/min (by C-G formula based on Cr of 1.28). No results found for this basename: VANCOTROUGH, Leodis Binet, VANCORANDOM, GENTTROUGH, GENTPEAK, GENTRANDOM, TOBRATROUGH, TOBRAPEAK, TOBRARND, AMIKACINPEAK, AMIKACINTROU, AMIKACIN,  in the last 72 hours   Microbiology: Recent Results (from the past 720 hour(s))  URINE CULTURE     Status: None   Collection Time    03/24/13  6:55 PM      Result Value Range Status   Specimen Description URINE, CLEAN CATCH   Final   Special Requests NONE   Final   Culture  Setup Time 03/24/2013 21:03   Final   Colony Count 70,000 COLONIES/ML   Final   Culture ESCHERICHIA COLI   Final   Report Status 03/27/2013 FINAL   Final   Organism ID, Bacteria ESCHERICHIA COLI   Final  MRSA PCR SCREENING     Status: None   Collection Time    03/24/13 11:51 PM      Result Value Range Status   MRSA by PCR NEGATIVE  NEGATIVE Final   Comment:            The GeneXpert MRSA Assay (FDA     approved for NASAL specimens     only), is one component of a     comprehensive MRSA colonization     surveillance program. It is not     intended to diagnose MRSA     infection nor to guide or     monitor treatment for     MRSA infections.   CULTURE, ROUTINE-ABSCESS     Status: None   Collection Time    03/25/13  3:16 PM      Result Value Range Status   Specimen Description ABSCESS ABDOMEN LEFT   Final   Special Requests NONE   Final   Gram Stain     Final   Value: ABUNDANT WBC PRESENT,BOTH PMN AND MONONUCLEAR     NO SQUAMOUS EPITHELIAL CELLS SEEN     MODERATE GRAM POSITIVE COCCI     IN PAIRS IN CLUSTERS   Culture     Final   Value: ABUNDANT STAPHYLOCOCCUS AUREUS     Note: RIFAMPIN AND GENTAMICIN SHOULD NOT BE USED AS SINGLE DRUGS FOR TREATMENT OF STAPH INFECTIONS.   Report Status PENDING   Incomplete  CULTURE, ROUTINE-ABSCESS     Status: None   Collection Time    03/25/13  3:29 PM      Result Value Range Status   Specimen Description ABSCESS ABDOMEN RIGHT   Final   Special Requests NONE   Final   Gram Stain     Final   Value: ABUNDANT WBC PRESENT,BOTH PMN AND MONONUCLEAR     NO SQUAMOUS EPITHELIAL CELLS SEEN     FEW GRAM POSITIVE COCCI  IN PAIRS IN CLUSTERS   Culture     Final   Value: MODERATE STAPHYLOCOCCUS AUREUS     Note: RIFAMPIN AND GENTAMICIN SHOULD NOT BE USED AS SINGLE DRUGS FOR TREATMENT OF STAPH INFECTIONS.   Report Status PENDING   Incomplete    Medical History: Past Medical History  Diagnosis Date  . Hypertension   . Obesity   . History of stomach ulcers     Assessment: Pt on Vancomycin and Zosyn Day#4 for intra-abdominal abscess/Ecoli in urine. Abscess cultures now growing abundant SA. S/p perc drain placed 5/1. Afebrile. Wbc down to nl.   Vanc 4/30>> Zosyn 4/30>>  5/1 Abscess x2 >>Abundant SA 4/30 Urine>>Ecoli 70kcol (pan sensitive)  Goal of Therapy:  Vancomycin trough level 15-20 mcg/ml  Plan:  1. Continue Zosyn 3.375 gm IV q8h, infuse each dose over 4 hours 2. Continue Vancomycin 1000 mg IV q12h. 3. Will check Vancomycin trough with tonight's dose. 4. Will f/u sensitivities, renal function, clinical condition  Christoper Fabian, PharmD, BCPS Clinical pharmacist, pager  [phone] 03/27/2013 11:50 AM

## 2013-03-28 LAB — CULTURE, ROUTINE-ABSCESS

## 2013-03-28 MED ORDER — SODIUM CHLORIDE 0.9 % IJ SOLN: 10.0000 mL | Freq: Two times a day (BID) | INTRAMUSCULAR | Status: DC

## 2013-03-28 MED ORDER — IOHEXOL 300 MG/ML  SOLN: 25.0000 mL | INTRAMUSCULAR | Status: AC

## 2013-03-28 MED ORDER — SODIUM CHLORIDE 0.9 % IJ SOLN: 10.0000 mL | INTRAMUSCULAR | Status: DC | PRN

## 2013-03-28 NOTE — Progress Notes (Signed)
  Subjective: Rt abd drains placed 5/1 +MRSA today Drains intact  Objective: Vital signs in last 24 hours: Temp:  [97.2 F (36.2 C)-98.4 F (36.9 C)] 97.8 F (36.6 C) (05/04 0602) Pulse Rate:  [71-78] 71 (05/04 0602) Resp:  [17-18] 17 (05/04 0602) BP: (107-119)/(56-81) 119/62 mmHg (05/04 0602) SpO2:  [99 %-100 %] 99 % (05/04 0602) Last BM Date: 03/27/13  Intake/Output from previous day: 05/03 0701 - 05/04 0700 In: 3683.7 [P.O.:1020; I.V.:2134.7; IV Piggyback:499] Out: 90 [Drains:90] Intake/Output this shift:   PE:  Afeb; VSS Cx: MRSA Tearful at Cx results Drains intact Output L: 70 cc yesterday             R: 20 cc yesterday Sites clean and dry    Lab Results:  No results found for this basename: WBC, HGB, HCT, PLT,  in the last 72 hours BMET No results found for this basename: NA, K, CL, CO2, GLUCOSE, BUN, CREATININE, CALCIUM,  in the last 72 hours PT/INR No results found for this basename: LABPROT, INR,  in the last 72 hours ABG No results found for this basename: PHART, PCO2, PO2, HCO3,  in the last 72 hours  Studies/Results: No results found.  Anti-infectives:   Assessment/Plan: s/p   Rt abd drains intact +MRSA Plan per surgery   LOS: 4 days    [name] [name] A 03/28/2013

## 2013-03-28 NOTE — Progress Notes (Signed)
Peripherally Inserted Central Catheter/Midline Placement  The IV Nurse has discussed with the patient and/or persons authorized to consent for the patient, the purpose of this procedure and the potential benefits and risks involved with this procedure.  The benefits include less needle sticks, lab draws from the catheter and patient may be discharged home with the catheter.  Risks include, but not limited to, infection, bleeding, blood clot (thrombus formation), and puncture of an artery; nerve damage and irregular heat beat.  Alternatives to this procedure were also discussed.  PICC/Midline Placement Documentation  PICC / Midline Single Lumen 03/28/13 PICC Right Basilic (Active)       Ethelda Chick 03/28/2013, 6:27 PM

## 2013-03-28 NOTE — Progress Notes (Addendum)
Patient ID: [name] [name], female   DOB: 1956-02-10, 57 y.o.   MRN: [id number]     Subjective: Pt tearful this am as she found out her cultures grew MRSA, she denies any symptom changes  Objective: Vital signs in last 24 hours: Temp:  [97.2 F (36.2 C)-98.4 F (36.9 C)] 97.8 F (36.6 C) (05/04 0602) Pulse Rate:  [71-78] 71 (05/04 0602) Resp:  [17-18] 17 (05/04 0602) BP: (107-119)/(56-81) 119/62 mmHg (05/04 0602) SpO2:  [99 %-100 %] 99 % (05/04 0602) Last BM Date: 03/27/13  Intake/Output from previous day: 05/03 0701 - 05/04 0700 In: 3683.7 [P.O.:1020; I.V.:2134.7; IV Piggyback:499] Out: 90 [Drains:90] Intake/Output this shift:    General: NAD Abdomen soft, no significant tenderness, no erythema, the drains have serosang fluid  Lab Results:  No results found for this basename: WBC, HGB, HCT, PLT,  in the last 72 hours BMET No results found for this basename: NA, K, CL, CO2, GLUCOSE, BUN, CREATININE, CALCIUM,  in the last 72 hours PT/INR No results found for this basename: LABPROT, INR,  in the last 72 hours ABG No results found for this basename: PHART, PCO2, PO2, HCO3,  in the last 72 hours  Studies/Results: No results found.  Anti-infectives: Anti-infectives   Start     Dose/Rate Route Frequency Ordered Stop   03/25/13 2200  vancomycin (VANCOCIN) 1,500 mg in sodium chloride 0.9 % 500 mL IVPB  Status:  Discontinued     1,500 mg 250 mL/hr over 120 Minutes Intravenous Every 24 hours 03/24/13 2006 03/25/13 0945   03/25/13 1000  vancomycin (VANCOCIN) IVPB 1000 mg/200 mL premix     1,000 mg 200 mL/hr over 60 Minutes Intravenous Every 12 hours 03/25/13 0945     03/25/13 0600  piperacillin-tazobactam (ZOSYN) IVPB 3.375 g     3.375 g 12.5 mL/hr over 240 Minutes Intravenous 3 times per day 03/24/13 2006     03/24/13 2100  vancomycin (VANCOCIN) 1,500 mg in sodium chloride 0.9 % 500 mL IVPB     1,500 mg 250 mL/hr over 120 Minutes Intravenous  Once 03/24/13 2005 03/24/13 2301    03/24/13 2015  piperacillin-tazobactam (ZOSYN) IVPB 3.375 g     3.375 g 100 mL/hr over 30 Minutes Intravenous  Once 03/24/13 2000 03/24/13 2047      Assessment/Plan: S/P 2 Percutaneous drains: Suspected subcutaneous abscess than may or may not communicate with chronic seroma around mesh.  Cultures growing MRSA.  On zosyn and vancomycin, likely can d/c zosyn  Unsure what the surgical plan will be as there is mesh near the area.  Dr. Lindie [name] and Dr. Jamey [name] will see the patient later today and make recommendations.  No changes otherwise for now.   LOS: 4 days    [name] [name] 03/28/2013  Patient seen and agree with above. We will get an ID consult and repeat CT tomorrow. Since she is essentially without symptoms it may be feasible to try a long term course of IV antibiotic, assuming that the drains are able to keep the abscess drained. If not, may need surgery and removal of mesh

## 2013-03-29 ENCOUNTER — Inpatient Hospital Stay (HOSPITAL_COMMUNITY): Payer: BC Managed Care – PPO

## 2013-03-29 ENCOUNTER — Encounter (INDEPENDENT_AMBULATORY_CARE_PROVIDER_SITE_OTHER): Payer: Self-pay | Admitting: *Deleted

## 2013-03-29 ENCOUNTER — Encounter (HOSPITAL_COMMUNITY): Payer: Self-pay | Admitting: Radiology

## 2013-03-29 LAB — BASIC METABOLIC PANEL
CO2: 25 mEq/L (ref 19–32)
Calcium: 8.4 mg/dL (ref 8.4–10.5)
Creatinine, Ser: 0.93 mg/dL (ref 0.50–1.10)

## 2013-03-29 MED ORDER — HYDROCODONE-ACETAMINOPHEN 5-325 MG PO TABS: 1.0000 | ORAL_TABLET | ORAL | Status: DC | PRN

## 2013-03-29 MED ORDER — IOHEXOL 300 MG/ML  SOLN
100.0000 mL | Freq: Once | INTRAMUSCULAR | Status: AC | PRN
  Administered 2013-03-29: 100 mL via INTRAVENOUS

## 2013-03-29 MED ORDER — DOXYCYCLINE HYCLATE 50 MG PO CAPS: 100.0000 mg | ORAL_CAPSULE | Freq: Two times a day (BID) | ORAL | Status: DC

## 2013-03-29 NOTE — Care Management Note (Signed)
  Page 1 of 1   03/29/2013     10:32:54 AM   CARE MANAGEMENT NOTE 03/29/2013  Patient:  IRMGARD, RAMPERSAUD   Account Number:  [id number]  Date Initiated:  03/25/2013  Documentation initiated by:  Donn Pierini  Subjective/Objective Assessment:   Pt admitted with large intraabdominal abscess- will need percutaneous drain     Action/Plan:   PTA pt lived at home alone- NCM to follow pt progress and potential d/c needs   Anticipated DC Date:  03/29/2013   Anticipated DC Plan:  HOME W HOME HEALTH SERVICES      DC Planning Services  CM consult      Choice offered to / List presented to:             Status of service:  In process, will continue to follow Medicare Important Message given?   (If response is "NO", the following Medicare IM given date fields will be blank) Date Medicare IM given:   Date Additional Medicare IM given:    Discharge Disposition:    Per UR Regulation:  Reviewed for med. necessity/level of care/duration of stay  If discussed at Long Length of Stay Meetings, dates discussed:    Comments:  03-29-13 Referral for [facility] flushes 5 cc / day . Spoke with patient , she has had drains in the past and feels comfortable flushing , does not want HH. Earl Gala PA aware . Will ask bedside nurse to send home 6 flushes with patient .  Ronny Flurry RN BSN [phone]

## 2013-03-29 NOTE — Discharge Summary (Signed)
Patient ID: [name] Walker MRN: [id number] DOB/AGE: 55-Nov-1957 55 y.o.  Admit date: 03/24/2013 Discharge date: 03/29/2013  Procedures: placement of 2 100F drains in Right anterior abdomen and LLQ  Consults: Interventional radiology  Reason for Admission: 57 yo wf who has a history of ventral hernia repair emergently by Dr. Dwain Sarna in 2011. Last Friday she felt a lump on her right upper abdominal wall. It has been tender for her. She denies any fever. She went to her medical doc on Tuesday who ordered a CT. She was called today and told to go to ER. CT shows an abscess partly in subq but mostly intraabdominal. No sign of obstruction or free air. She has had some low BP but no tachycardia.  Admission Diagnoses:  1. Intraabdominal abscess, s/p ventral hernia repair several years ago 2. HTN 3. obesity  Hospital Course: The patient was admitted and placed on IV abx therapy.  IR was consulted and the patient has two abdominal drains placed in these abscesses.  Cultures were obtained from these.  They revealed MRSA.  The patient's diet was able to be advanced as tolerated.  She had a repeat CT scan on HD 5.  This revealed resolved abscess collections.  However, due to her growth of MRSA, we wanted to leave her drains in for now.  She will be switched from vanc and zosyn to oral doxy at home.  She was stable on HD6 for dc home.  Discharge Diagnoses:  Principal Problem:   Abdominal abscess   Discharge Medications:   Medication List    TAKE these medications       doxycycline 50 MG capsule  Commonly known as:  VIBRAMYCIN  Take 2 capsules (100 mg total) by mouth 2 (two) times daily.     HYDROcodone-acetaminophen 5-325 MG per tablet  Commonly known as:  NORCO/VICODIN  Take 1-2 tablets by mouth every 4 (four) hours as needed.     ibuprofen 200 MG tablet  Commonly known as:  ADVIL,MOTRIN  Take 800 mg by mouth every 6 (six) hours as needed for pain.     lisinopril-hydrochlorothiazide 20-25  MG per tablet  Commonly known as:  PRINZIDE,ZESTORETIC  Take 1 tablet by mouth daily.     multivitamin with minerals Tabs  Take 1 tablet by mouth daily.        Discharge Instructions:     Follow-up Information   Follow up with [facility] A, MD On 04/02/2013. (arrive at 1:10pm, for a 1:30pm appointment)    Contact information:   [address] [place] [address] [phone]       Signed: Letha Cape 03/29/2013, 10:23 AM

## 2013-03-29 NOTE — Progress Notes (Signed)
Pt discharged to home

## 2013-03-29 NOTE — Progress Notes (Signed)
  Subjective: Reports feeling well this morning.  Waiting for CT today.  Reports tolerating diet well without nausea or vomiting.  Regular BMs.  States the knot in her abdomen is better, no pain.  Would prefer to not have surgery.  Objective: Vital signs in last 24 hours: Temp:  [97.8 F (36.6 C)-98.2 F (36.8 C)] 97.8 F (36.6 C) (05/05 0537) Pulse Rate:  [67-75] 71 (05/05 0537) Resp:  [18] 18 (05/05 0537) BP: (108-137)/(47-90) 123/47 mmHg (05/05 0537) SpO2:  [99 %-100 %] 99 % (05/05 0537) Last BM Date: 03/27/13  Intake/Output from previous day: 05/04 0701 - 05/05 0700 In: 1346 [P.O.:480; I.V.:742; IV Piggyback:124] Out: 60 [Drains:60] Intake/Output this shift:    PE: Abd: good bowel sounds; soft, non tender, non distended; drains in place with serosanguinous fluid (R>L).   Lab Results:  No results found for this basename: WBC, HGB, HCT, PLT,  in the last 72 hours BMET  Recent Labs  03/28/13 2318  NA 139  K 3.2*  CL 104  CO2 25  GLUCOSE 121*  BUN 8  CREATININE 0.93  CALCIUM 8.4   PT/INR No results found for this basename: LABPROT, INR,  in the last 72 hours CMP     Component Value Date/Time   NA 139 03/28/2013 2318   K 3.2* 03/28/2013 2318   CL 104 03/28/2013 2318   CO2 25 03/28/2013 2318   GLUCOSE 121* 03/28/2013 2318   BUN 8 03/28/2013 2318   CREATININE 0.93 03/28/2013 2318   CALCIUM 8.4 03/28/2013 2318   PROT 8.9* 03/24/2013 1544   ALBUMIN 4.1 03/24/2013 1544   AST 23 03/24/2013 1544   ALT 17 03/24/2013 1544   ALKPHOS 78 03/24/2013 1544   BILITOT 0.4 03/24/2013 1544   GFRNONAA 68* 03/28/2013 2318   GFRAA 79* 03/28/2013 2318   Lipase  No results found for this basename: lipase       Studies/Results: No results found.  Anti-infectives: Anti-infectives   Start     Dose/Rate Route Frequency Ordered Stop   03/25/13 2200  vancomycin (VANCOCIN) 1,500 mg in sodium chloride 0.9 % 500 mL IVPB  Status:  Discontinued     1,500 mg 250 mL/hr over 120 Minutes Intravenous  Every 24 hours 03/24/13 2006 03/25/13 0945   03/25/13 1000  vancomycin (VANCOCIN) IVPB 1000 mg/200 mL premix     1,000 mg 200 mL/hr over 60 Minutes Intravenous Every 12 hours 03/25/13 0945     03/25/13 0600  piperacillin-tazobactam (ZOSYN) IVPB 3.375 g     3.375 g 12.5 mL/hr over 240 Minutes Intravenous 3 times per day 03/24/13 2006     03/24/13 2100  vancomycin (VANCOCIN) 1,500 mg in sodium chloride 0.9 % 500 mL IVPB     1,500 mg 250 mL/hr over 120 Minutes Intravenous  Once 03/24/13 2005 03/24/13 2301   03/24/13 2015  piperacillin-tazobactam (ZOSYN) IVPB 3.375 g     3.375 g 100 mL/hr over 30 Minutes Intravenous  Once 03/24/13 2000 03/24/13 2047       Assessment/Plan 1. Abdominal wall abscess - 2 percutaneous drains placed 5/1; possible communication with chronic seroma surrounding mesh; cultures growing MRSA   Plan 1. Continue vanc, will likely d/c zosyn 2. Will consult ID 3. F/u CT scan   LOS: 5 days    Walker, [name] Flythe 03/29/2013, 8:39 AM Pager: [phone]

## 2013-03-29 NOTE — Progress Notes (Signed)
CT scan shows resolution of fluid collections  I think she can go home with drains in place and on oral doxycycline for 3 weeks.  I will see her in the office this Thursday or Friday to remove the drains  [name] A. Magnus Ivan  MD, FACS

## 2013-04-02 ENCOUNTER — Encounter (INDEPENDENT_AMBULATORY_CARE_PROVIDER_SITE_OTHER): Payer: Self-pay | Admitting: General Surgery

## 2013-04-02 ENCOUNTER — Encounter (INDEPENDENT_AMBULATORY_CARE_PROVIDER_SITE_OTHER): Payer: Self-pay | Admitting: Surgery

## 2013-04-02 ENCOUNTER — Ambulatory Visit (INDEPENDENT_AMBULATORY_CARE_PROVIDER_SITE_OTHER): Payer: BC Managed Care – PPO | Admitting: Surgery

## 2013-04-02 VITALS — BP 110/60 | HR 60 | Resp 14 | Ht 67.0 in | Wt 253.0 lb

## 2013-04-02 DIAGNOSIS — L02211 Cutaneous abscess of abdominal wall: Secondary | ICD-10-CM

## 2013-04-02 DIAGNOSIS — L02219 Cutaneous abscess of trunk, unspecified: Secondary | ICD-10-CM

## 2013-04-02 NOTE — Progress Notes (Signed)
Subjective:     Patient ID: [name] Walker, female   DOB: Apr 24, 1956, 57 y.o.   MRN: [id number]  HPI She is here for a followup visit from her most recent hospitalization for an abdominal wall abscess. Percutaneous drains had been placed. The cultures grew out MRSA. She is having some loose bowel movements but otherwise denies any fevers. Her CAT scan prior to discharge showed total resolution of the fluid collections  Review of Systems     Objective:   Physical Exam On exam, she is afebrile. Her abdomen is soft. There is no erythema of the abdominal wall. 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 If she recurs, she may need open drainage in the operating room. I will see her back in 2 weeks. We will continue the doxycycline

## 2013-04-16 ENCOUNTER — Ambulatory Visit (INDEPENDENT_AMBULATORY_CARE_PROVIDER_SITE_OTHER): Payer: BC Managed Care – PPO | Admitting: Surgery

## 2013-04-16 ENCOUNTER — Encounter (INDEPENDENT_AMBULATORY_CARE_PROVIDER_SITE_OTHER): Payer: Self-pay | Admitting: Surgery

## 2013-04-16 VITALS — BP 138/80 | HR 64 | Temp 97.4°F | Resp 18 | Ht 67.0 in | Wt 259.4 lb

## 2013-04-16 DIAGNOSIS — L03319 Cellulitis of trunk, unspecified: Secondary | ICD-10-CM

## 2013-04-16 DIAGNOSIS — L02211 Cutaneous abscess of abdominal wall: Secondary | ICD-10-CM

## 2013-04-16 NOTE — Progress Notes (Signed)
Subjective:     Patient ID: [name] Walker, female   DOB: [birth year], 57 y.o.   MRN: [id number]  HPI She is doing well except for recently developing a rash. She has had no abdominal pain or fevers. She has 3 more days of antibiotics remaining  Review of Systems     Objective:   Physical Exam On exam, she is afebrile. Her abdomen is totally benign with no erythema or palpable mass or tenderness    Assessment:     Resolved abdominal wall abscess     Plan:     We will hold off antibiotics once this 21 day course is completed. I will see her back on June 6. She will call back sooner should she develop any symptoms

## 2013-04-30 ENCOUNTER — Encounter (INDEPENDENT_AMBULATORY_CARE_PROVIDER_SITE_OTHER): Payer: BC Managed Care – PPO | Admitting: Surgery

## 2013-06-21 ENCOUNTER — Encounter (INDEPENDENT_AMBULATORY_CARE_PROVIDER_SITE_OTHER): Payer: BC Managed Care – PPO | Admitting: Surgery

## 2013-09-06 ENCOUNTER — Other Ambulatory Visit: Payer: Self-pay

## 2013-09-06 DIAGNOSIS — Z1231 Encounter for screening mammogram for malignant neoplasm of breast: Secondary | ICD-10-CM

## 2013-09-29 ENCOUNTER — Ambulatory Visit
Admission: RE | Admit: 2013-09-29 | Discharge: 2013-09-29 | Disposition: A | Discharge status: Home / Self Care | Payer: No Typology Code available for payment source | Source: Ambulatory Visit

## 2013-09-29 DIAGNOSIS — Z1231 Encounter for screening mammogram for malignant neoplasm of breast: Secondary | ICD-10-CM

## 2013-09-30 ENCOUNTER — Other Ambulatory Visit: Payer: Self-pay

## 2019-08-04 ENCOUNTER — Other Ambulatory Visit: Payer: Self-pay

## 2019-08-04 ENCOUNTER — Telehealth: Payer: No Typology Code available for payment source | Admitting: Family

## 2019-08-04 DIAGNOSIS — Z20822 Contact with and (suspected) exposure to covid-19: Secondary | ICD-10-CM

## 2019-08-04 NOTE — Progress Notes (Signed)
E-Visit for Corona Virus Screening   Your current symptoms could be consistent with the coronavirus.  Many health care providers can now test patients at their office but not all are.  [facility] has multiple testing sites. For information on our COVID testing locations and hours go to [email or web address]  Please quarantine yourself while awaiting your test results.  We are enrolling you in our MyChart Home Montioring for COVID19 . Daily you will receive a questionnaire within the MyChart website. Our COVID 19 response team willl be monitoriing your responses daily.  Approximately 5 minutes was spent documenting and reviewing patient's chart.   You can go to one of the  testing sites listed below, while they are opened (see hours). You do not need an order and will stay in your car during the test. You do need to self isolate until your results return and if positive 14 days from when your symptoms started and until you are 3 days symptom free.   Testing Locations (Monday - Friday, 8 a.m. - 3:30 p.m.) . [place]: [facility], [address], [place]  . [place]: [address] [facility], [place], [place] (entrance off [address])  . [facility]: (Closed each Monday): Testing site relocated to the short stay covered drive at [facility]. (Use the [facility] entrance to [facility] next to [facility].)   COVID-19 is a respiratory illness with symptoms that are similar to the flu. Symptoms are typically mild to moderate, but there have been cases of severe illness and death due to the virus. The following symptoms may appear 2-14 days after exposure: . Fever . Cough . Shortness of breath or difficulty breathing . Chills . Repeated shaking with chills . Muscle pain . Headache . Sore throat . New loss of taste or smell . Fatigue . Congestion or runny  nose . Nausea or vomiting . Diarrhea  It is vitally important that if you feel that you have an infection such as this virus or any other virus that you stay home and away from places where you may spread it to others.  You should self-quarantine for 14 days if you have symptoms that could potentially be coronavirus or have been in close contact a with a person diagnosed with COVID-19 within the last 2 weeks. You should avoid contact with people age 61 and older.   You should wear a mask or cloth face covering over your nose and mouth if you must be around other people or animals, including pets (even at home). Try to stay at least 6 feet away from other people. This will protect the people around you.    You may also take acetaminophen (Tylenol) as needed for fever.   Reduce your risk of any infection by using the same precautions used for avoiding the common cold or flu:  Marland Kitchen. Wash your hands often with soap and warm water for at least 20 seconds.  If soap and water are not readily available, use an alcohol-based hand sanitizer with at least 60% alcohol.  . If coughing or sneezing, cover your mouth and nose by coughing or sneezing into the elbow areas of your shirt or coat, into a tissue or into your sleeve (not your hands). . Avoid shaking hands with others and consider head nods or verbal greetings only. . Avoid touching your eyes, nose, or mouth with unwashed hands.  . Avoid close contact with  people who are sick. . Avoid places or events with large numbers of people in one location, like concerts or sporting events. . Carefully consider travel plans you have or are making. . If you are planning any travel outside or inside the [place], visit the CDC's Travelers' Health webpage for the latest health notices. . If you have some symptoms but not all symptoms, continue to monitor at home and seek medical attention if your symptoms worsen. . If you are having a medical emergency, call 911.  HOME  CARE . Only take medications as instructed by your medical team. . Drink plenty of fluids and get plenty of rest. . A steam or ultrasonic humidifier can help if you have congestion.   GET HELP RIGHT AWAY IF YOU HAVE EMERGENCY WARNING SIGNS** FOR COVID-19. If you or someone is showing any of these signs seek emergency medical care immediately. Call 911 or proceed to your closest emergency facility if: . You develop worsening high fever. . Trouble breathing . Bluish lips or face . Persistent pain or pressure in the chest . New confusion . Inability to wake or stay awake . You cough up blood. . Your symptoms become more severe  **This list is not all possible symptoms. Contact your medical provider for any symptoms that are sever or concerning to you.   MAKE SURE YOU   Understand these instructions.  Will watch your condition.  Will get help right away if you are not doing well or get worse.  Your e-visit answers were reviewed by a board certified advanced clinical practitioner to complete your personal care plan.  Depending on the condition, your plan could have included both over the counter or prescription medications.  If there is a problem please reply once you have received a response from your provider.  Your safety is important to [place].  If you have drug allergies check your prescription carefully.    You can use MyChart to ask questions about today's visit, request a non-urgent call back, or ask for a work or school excuse for 24 hours related to this e-Visit. If it has been greater than 24 hours you will need to follow up with your provider, or enter a new e-Visit to address those concerns. You will get an e-mail in the next two days asking about your experience.  I hope that your e-visit has been valuable and will speed your recovery. Thank you for using e-visits.

## 2019-08-05 ENCOUNTER — Encounter (INDEPENDENT_AMBULATORY_CARE_PROVIDER_SITE_OTHER): Payer: Self-pay

## 2019-08-05 ENCOUNTER — Telehealth: Payer: Self-pay | Admitting: *Deleted

## 2019-08-05 NOTE — Telephone Encounter (Signed)
Patient reports diarrhea as a new symptoms this morning. 3 partial watery stools today. Denies all other symptoms. Care Advice reviewed with patient including drinking extra fluids today and avoiding greasy foods and caffeine products. Stick to bland foods today. May take OTC imodium as directed on the package. Signs of he hydration. Temperature elevated this morning. Recommended antipyretics for temperature over 101. If fever remains for greater than 3 days, please call your PCP. Stated she understood.

## 2019-08-05 NOTE — Telephone Encounter (Signed)
Patient's MyChart questionnaire had a BPA due to new onset of diarrhea and fever continues. No answer, left VM to return call for triage of the diarrhea.

## 2019-08-06 LAB — NOVEL CORONAVIRUS, NAA: SARS-CoV-2, NAA: NOT DETECTED

## 2019-08-13 ENCOUNTER — Encounter (INDEPENDENT_AMBULATORY_CARE_PROVIDER_SITE_OTHER): Payer: Self-pay

## 2019-08-16 ENCOUNTER — Encounter (INDEPENDENT_AMBULATORY_CARE_PROVIDER_SITE_OTHER): Payer: Self-pay

## 2019-08-17 ENCOUNTER — Encounter (INDEPENDENT_AMBULATORY_CARE_PROVIDER_SITE_OTHER): Payer: Self-pay

## 2019-12-03 ENCOUNTER — Ambulatory Visit: Payer: Self-pay | Attending: Internal Medicine

## 2019-12-03 DIAGNOSIS — Z20822 Contact with and (suspected) exposure to covid-19: Secondary | ICD-10-CM | POA: Insufficient documentation

## 2019-12-05 LAB — NOVEL CORONAVIRUS, NAA: SARS-CoV-2, NAA: NOT DETECTED

## 2020-05-10 ENCOUNTER — Inpatient Hospital Stay (HOSPITAL_COMMUNITY)
Admit: 2020-05-10 | Discharge: 2020-05-13 | DRG: 580 | Disposition: A | Discharge status: Home / Self Care | Payer: Self-pay | Attending: Internal Medicine | Admitting: Internal Medicine

## 2020-05-10 ENCOUNTER — Other Ambulatory Visit: Payer: Self-pay

## 2020-05-10 ENCOUNTER — Emergency Department (HOSPITAL_COMMUNITY): Payer: Self-pay

## 2020-05-10 DIAGNOSIS — Z9884 Bariatric surgery status: Secondary | ICD-10-CM

## 2020-05-10 DIAGNOSIS — L02211 Cutaneous abscess of abdominal wall: Principal | ICD-10-CM | POA: Diagnosis present

## 2020-05-10 DIAGNOSIS — Z833 Family history of diabetes mellitus: Secondary | ICD-10-CM

## 2020-05-10 DIAGNOSIS — Z79899 Other long term (current) drug therapy: Secondary | ICD-10-CM

## 2020-05-10 DIAGNOSIS — L0291 Cutaneous abscess, unspecified: Secondary | ICD-10-CM

## 2020-05-10 DIAGNOSIS — D649 Anemia, unspecified: Secondary | ICD-10-CM | POA: Diagnosis present

## 2020-05-10 DIAGNOSIS — Z87891 Personal history of nicotine dependence: Secondary | ICD-10-CM

## 2020-05-10 DIAGNOSIS — Z8614 Personal history of Methicillin resistant Staphylococcus aureus infection: Secondary | ICD-10-CM

## 2020-05-10 DIAGNOSIS — Z20822 Contact with and (suspected) exposure to covid-19: Secondary | ICD-10-CM | POA: Diagnosis present

## 2020-05-10 DIAGNOSIS — K439 Ventral hernia without obstruction or gangrene: Secondary | ICD-10-CM | POA: Diagnosis present

## 2020-05-10 DIAGNOSIS — I10 Essential (primary) hypertension: Secondary | ICD-10-CM | POA: Diagnosis present

## 2020-05-10 DIAGNOSIS — Z8711 Personal history of peptic ulcer disease: Secondary | ICD-10-CM

## 2020-05-10 DIAGNOSIS — Z8249 Family history of ischemic heart disease and other diseases of the circulatory system: Secondary | ICD-10-CM

## 2020-05-10 DIAGNOSIS — Z885 Allergy status to narcotic agent status: Secondary | ICD-10-CM

## 2020-05-10 DIAGNOSIS — Z91040 Latex allergy status: Secondary | ICD-10-CM

## 2020-05-10 DIAGNOSIS — Z6838 Body mass index (BMI) 38.0-38.9, adult: Secondary | ICD-10-CM

## 2020-05-10 DIAGNOSIS — E871 Hypo-osmolality and hyponatremia: Secondary | ICD-10-CM | POA: Diagnosis present

## 2020-05-10 DIAGNOSIS — E669 Obesity, unspecified: Secondary | ICD-10-CM | POA: Diagnosis present

## 2020-05-10 LAB — URINALYSIS, ROUTINE W REFLEX MICROSCOPIC
Bacteria, UA: NONE SEEN
Bilirubin Urine: NEGATIVE
Glucose, UA: NEGATIVE mg/dL
Hgb urine dipstick: NEGATIVE
Ketones, ur: 20 mg/dL — AB
Nitrite: NEGATIVE
Protein, ur: NEGATIVE mg/dL
Specific Gravity, Urine: 1.005 (ref 1.005–1.030)
pH: 5 (ref 5.0–8.0)

## 2020-05-10 LAB — COMPREHENSIVE METABOLIC PANEL
ALT: 22 U/L (ref 0–44)
AST: 24 U/L (ref 15–41)
Albumin: 3.4 g/dL — ABNORMAL LOW (ref 3.5–5.0)
Alkaline Phosphatase: 66 U/L (ref 38–126)
Anion gap: 13 (ref 5–15)
BUN: 13 mg/dL (ref 8–23)
CO2: 22 mmol/L (ref 22–32)
Calcium: 9.3 mg/dL (ref 8.9–10.3)
Chloride: 98 mmol/L (ref 98–111)
Creatinine, Ser: 0.95 mg/dL (ref 0.44–1.00)
GFR calc Af Amer: >60 mL/min (ref >60)
GFR calc non Af Amer: >60 mL/min (ref >60)
Glucose, Bld: 98 mg/dL (ref 70–99)
Potassium: 3.6 mmol/L (ref 3.5–5.1)
Sodium: 133 mmol/L — ABNORMAL LOW (ref 135–145)
Total Bilirubin: 0.9 mg/dL (ref 0.3–1.2)
Total Protein: 7.8 g/dL (ref 6.5–8.1)

## 2020-05-10 LAB — CBC WITH DIFFERENTIAL/PLATELET
Abs Immature Granulocytes: 0.09 10*3/uL — ABNORMAL HIGH (ref 0.00–0.07)
Basophils Absolute: 0 10*3/uL (ref 0.0–0.1)
Basophils Relative: 1 %
Eosinophils Absolute: 0.1 10*3/uL (ref 0.0–0.5)
Eosinophils Relative: 1 %
HCT: 37.8 % (ref 36.0–46.0)
Hemoglobin: 11.6 g/dL — ABNORMAL LOW (ref 12.0–15.0)
Immature Granulocytes: 1 %
Lymphocytes Relative: 18 %
Lymphs Abs: 1.4 10*3/uL (ref 0.7–4.0)
MCH: 25.6 pg — ABNORMAL LOW (ref 26.0–34.0)
MCHC: 30.7 g/dL (ref 30.0–36.0)
MCV: 83.3 fL (ref 80.0–100.0)
Monocytes Absolute: 1 10*3/uL (ref 0.1–1.0)
Monocytes Relative: 12 %
Neutro Abs: 5.5 10*3/uL (ref 1.7–7.7)
Neutrophils Relative %: 67 %
Platelets: 369 10*3/uL (ref 150–400)
RBC: 4.54 MIL/uL (ref 3.87–5.11)
RDW: 13.5 % (ref 11.5–15.5)
WBC: 8.1 10*3/uL (ref 4.0–10.5)
nRBC: 0 % (ref 0.0–0.2)

## 2020-05-10 LAB — LACTIC ACID, PLASMA: Lactic Acid, Venous: 0.9 mmol/L (ref 0.5–1.9)

## 2020-05-10 MED ORDER — SODIUM CHLORIDE 0.9 % IV BOLUS
1000.0000 mL | Freq: Once | INTRAVENOUS | Status: AC
  Administered 2020-05-10: 1000 mL via INTRAVENOUS

## 2020-05-10 MED ORDER — SODIUM CHLORIDE 0.9% FLUSH: 3.0000 mL | Freq: Once | INTRAVENOUS | Status: DC

## 2020-05-10 MED ORDER — PIPERACILLIN-TAZOBACTAM 3.375 G IVPB 30 MIN
3.3750 g | Freq: Once | INTRAVENOUS | Status: AC
  Administered 2020-05-10: 3.375 g via INTRAVENOUS
  Filled 2020-05-10: qty 50

## 2020-05-10 MED ORDER — IOHEXOL 300 MG/ML  SOLN
100.0000 mL | Freq: Once | INTRAMUSCULAR | Status: AC | PRN
  Administered 2020-05-10: 100 mL via INTRAVENOUS

## 2020-05-10 MED ORDER — CLINDAMYCIN PHOSPHATE 600 MG/50ML IV SOLN
600.0000 mg | Freq: Once | INTRAVENOUS | Status: AC
  Administered 2020-05-10: 600 mg via INTRAVENOUS
  Filled 2020-05-10: qty 50

## 2020-05-10 NOTE — Consult Note (Signed)
CC: Abdominal wall abscess  Requesting provider: Dr. Lockie Mola  HPI: [name] Walker is an 64 y.o. female with hx of HTN, obesity, prior RYGYB underwent VHR with mesh 2011 by my partner Dr. Dwain Sarna.  She was admitted with MRSA related abdominal wall abscess in 2014 managed with percutaneous drain placements and recovered.  She had done well in the interim until approximately 3 weeks ago when she began having progressive swelling to the right side of midline of her abdominal wall.  Over the last week she has developed redness and warmth.  She has had low-grade temperatures at home.  She denies nausea/vomiting.  She presented to the emergency department for evaluation.  Past Medical History:  Diagnosis Date  . History of stomach ulcers   . Hypertension   . Obesity     Past Surgical History:  Procedure Laterality Date  . ABDOMINAL HYSTERECTOMY    . ABDOMINAL SURGERY     ulcers  . GASTRIC BYPASS    . HERNIA REPAIR      Family History  Problem Relation Age of Onset  . Heart disease Mother   . Diabetes Mother   . Heart disease Father     Social:  reports that she quit smoking about 21 years ago. She does not have any smokeless tobacco history on file. She reports current alcohol use. She reports that she does not use drugs.  Allergies:  Allergies  Allergen Reactions  . Tramadol Nausea And Vomiting  . Latex Rash and Other (See Comments)    NO POWDERED GLOVES- had to be treated with Benadryl!!    Medications: I have reviewed the patient's current medications.  Results for orders placed or performed during the hospital encounter of 05/10/20 (from the past 48 hour(s))  Lactic acid, plasma     Status: None   Collection Time: 05/10/20  4:24 PM  Result Value Ref Range   Lactic Acid, Venous 0.9 0.5 - 1.9 mmol/L    Comment: Performed at [facility], [address], [place], [place] [address]  Comprehensive metabolic panel     Status: Abnormal   Collection Time: 05/10/20   4:24 PM  Result Value Ref Range   Sodium 133 (L) 135 - 145 mmol/L   Potassium 3.6 3.5 - 5.1 mmol/L   Chloride 98 98 - 111 mmol/L   CO2 22 22 - 32 mmol/L   Glucose, Bld 98 70 - 99 mg/dL    Comment: Glucose reference range applies only to samples taken after fasting for at least 8 hours.   BUN 13 8 - 23 mg/dL   Creatinine, Ser 7.79 0.44 - 1.00 mg/dL   Calcium 9.3 8.9 - 39.0 mg/dL   Total Protein 7.8 6.5 - 8.1 g/dL   Albumin 3.4 (L) 3.5 - 5.0 g/dL   AST 24 15 - 41 U/L   ALT 22 0 - 44 U/L   Alkaline Phosphatase 66 38 - 126 U/L   Total Bilirubin 0.9 0.3 - 1.2 mg/dL   GFR calc non Af Amer >60 >60 mL/min   GFR calc Af Amer >60 >60 mL/min   Anion gap 13 5 - 15    Comment: Performed at [facility], [address], [place], [place] [address]  CBC with Differential     Status: Abnormal   Collection Time: 05/10/20  4:24 PM  Result Value Ref Range   WBC 8.1 4.0 - 10.5 K/uL   RBC 4.54 3.87 - 5.11 MIL/uL   Hemoglobin  11.6 (L) 12.0 - 15.0 g/dL   HCT 69.4 36 - 46 %   MCV 83.3 80.0 - 100.0 fL   MCH 25.6 (L) 26.0 - 34.0 pg   MCHC 30.7 30.0 - 36.0 g/dL   RDW 85.4 62.7 - 03.5 %   Platelets 369 150 - 400 K/uL   nRBC 0.0 0.0 - 0.2 %   Neutrophils Relative % 67 %   Neutro Abs 5.5 1.7 - 7.7 K/uL   Lymphocytes Relative 18 %   Lymphs Abs 1.4 0.7 - 4.0 K/uL   Monocytes Relative 12 %   Monocytes Absolute 1.0 0 - 1 K/uL   Eosinophils Relative 1 %   Eosinophils Absolute 0.1 0 - 0 K/uL   Basophils Relative 1 %   Basophils Absolute 0.0 0 - 0 K/uL   Immature Granulocytes 1 %   Abs Immature Granulocytes 0.09 (H) 0.00 - 0.07 K/uL    Comment: Performed at [facility], [address] [place], [place] [address]  Urinalysis, Routine w reflex microscopic     Status: Abnormal   Collection Time: 05/10/20  7:44 PM  Result Value Ref Range   Color, Urine YELLOW YELLOW   APPearance CLEAR CLEAR   Specific Gravity, Urine 1.005 1.005 - 1.030   pH 5.0 5.0 - 8.0   Glucose, UA NEGATIVE NEGATIVE mg/dL    Hgb urine dipstick NEGATIVE NEGATIVE   Bilirubin Urine NEGATIVE NEGATIVE   Ketones, ur 20 (A) NEGATIVE mg/dL   Protein, ur NEGATIVE NEGATIVE mg/dL   Nitrite NEGATIVE NEGATIVE   Leukocytes,Ua TRACE (A) NEGATIVE   RBC / HPF 0-5 0 - 5 RBC/hpf   WBC, UA 0-5 0 - 5 WBC/hpf   Bacteria, UA NONE SEEN NONE SEEN   Squamous Epithelial / LPF 0-5 0 - 5    Comment: Performed at [facility], [address], [place], [place] [address]    CT Abdomen Pelvis W Contrast  Result Date: 05/10/2020 CLINICAL DATA:  Redness and swelling of right abdomen EXAM: CT ABDOMEN AND PELVIS WITH CONTRAST TECHNIQUE: Multidetector CT imaging of the abdomen and pelvis was performed using the standard protocol following bolus administration of intravenous contrast. CONTRAST:  OMNIPAQUE IOHEXOL 300 MG/ML  SOLN COMPARISON:  03/29/2013 FINDINGS: Lower chest: Lung bases are clear. No effusions. Heart is normal size. Hepatobiliary: No focal hepatic abnormality. Gallbladder unremarkable. Pancreas: No focal abnormality or ductal dilatation. Spleen: No focal abnormality.  Normal size. Adrenals/Urinary Tract: No adrenal abnormality. No focal renal abnormality. No stones or hydronephrosis. Urinary bladder is unremarkable. Stomach/Bowel: Sigmoid diverticulosis. No active diverticulitis. Appendix is normal. Stomach and small bowel decompressed, grossly unremarkable. Postoperative changes in the stomach. Vascular/Lymphatic: Aortic atherosclerosis. No enlarged abdominal or pelvic lymph nodes. Reproductive: Prior hysterectomy.  No adnexal masses. Other: No free fluid or free air. There is a fluid collection within the right anterior abdominal wall measuring 8.3 x 5.7 cm concerning for abscess. Musculoskeletal: No acute bony abnormality. IMPRESSION: 8.3 x 5.7 cm fluid collection in the right anterior abdominal wall most compatible with abscess. No intraperitoneal extension. Aortic atherosclerosis. Sigmoid diverticulosis. Electronically  Signed   By: Charlett Nose M.D.   On: 05/10/2020 22:55    ROS - all of the below systems have been reviewed with the patient and positives are indicated with bold text General: chills, fever or night sweats Eyes: blurry vision or double vision ENT: epistaxis or sore throat Allergy/Immunology: itchy/watery eyes or nasal congestion Hematologic/Lymphatic: bleeding problems, blood clots or swollen lymph  nodes Endocrine: temperature intolerance or unexpected weight changes Breast: new or changing breast lumps or nipple discharge Resp: cough, shortness of breath, or wheezing CV: chest pain or dyspnea on exertion GI: as per HPI GU: dysuria, trouble voiding, or hematuria MSK: joint pain or joint stiffness Neuro: TIA or stroke symptoms Derm: pruritus and skin lesion changes Psych: anxiety and depression  PE Blood pressure 116/73, pulse 94, temperature 99.8 F (37.7 C), temperature source Oral, resp. rate 16, height 5\' 4"  (1.626 m), weight 100.7 kg, SpO2 100 %. Constitutional: NAD; conversant; no deformities Eyes: Moist conjunctiva; no lid lag; anicteric; PERRL Neck: Trachea midline; no thyromegaly Lungs: Normal respiratory effort; no tactile fremitus CV: RRR; no palpable thrills; no pitting edema GI: Abd soft, fluctuance and subcutaneous abscess to the right of midline with overlying erythema and warmth.  No active drainage. No palpable hepatosplenomegaly MSK: Normal range of motion of extremities; no clubbing/cyanosis Psychiatric: Appropriate affect; alert and oriented x3 Lymphatic: No palpable cervical or axillary lymphadenopathy  Results for orders placed or performed during the hospital encounter of 05/10/20 (from the past 48 hour(s))  Lactic acid, plasma     Status: None   Collection Time: 05/10/20  4:24 PM  Result Value Ref Range   Lactic Acid, Venous 0.9 0.5 - 1.9 mmol/L    Comment: Performed at [facility], [address] [place], [place]  Comprehensive  metabolic panel     Status: Abnormal   Collection Time: 05/10/20  4:24 PM  Result Value Ref Range   Sodium 133 (L) 135 - 145 mmol/L   Potassium 3.6 3.5 - 5.1 mmol/L   Chloride 98 98 - 111 mmol/L   CO2 22 22 - 32 mmol/L   Glucose, Bld 98 70 - 99 mg/dL    Comment: Glucose reference range applies only to samples taken after fasting for at least 8 hours.   BUN 13 8 - 23 mg/dL   Creatinine, Ser 05/12/20 0.44 - 1.00 mg/dL   Calcium 9.3 8.9 - 8.93 mg/dL   Total Protein 7.8 6.5 - 8.1 g/dL   Albumin 3.4 (L) 3.5 - 5.0 g/dL   AST 24 15 - 41 U/L   ALT 22 0 - 44 U/L   Alkaline Phosphatase 66 38 - 126 U/L   Total Bilirubin 0.9 0.3 - 1.2 mg/dL   GFR calc non Af Amer >60 >60 mL/min   GFR calc Af Amer >60 >60 mL/min   Anion gap 13 5 - 15    Comment: Performed at [facility], [address], [place], [place]  CBC with Differential     Status: Abnormal   Collection Time: 05/10/20  4:24 PM  Result Value Ref Range   WBC 8.1 4.0 - 10.5 K/uL   RBC 4.54 3.87 - 5.11 MIL/uL   Hemoglobin 11.6 (L) 12.0 - 15.0 g/dL   HCT 05/12/20 36 - 46 %   MCV 83.3 80.0 - 100.0 fL   MCH 25.6 (L) 26.0 - 34.0 pg   MCHC 30.7 30.0 - 36.0 g/dL   RDW 11.5 72.6 - 20.3 %   Platelets 369 150 - 400 K/uL   nRBC 0.0 0.0 - 0.2 %   Neutrophils Relative % 67 %   Neutro Abs 5.5 1.7 - 7.7 K/uL   Lymphocytes Relative 18 %   Lymphs Abs 1.4 0.7 - 4.0 K/uL   Monocytes Relative 12 %   Monocytes Absolute 1.0 0 - 1 K/uL   Eosinophils Relative  1 %   Eosinophils Absolute 0.1 0 - 0 K/uL   Basophils Relative 1 %   Basophils Absolute 0.0 0 - 0 K/uL   Immature Granulocytes 1 %   Abs Immature Granulocytes 0.09 (H) 0.00 - 0.07 K/uL    Comment: Performed at [facility] [address] [place], [place] [address]  Urinalysis, Routine w reflex microscopic     Status: Abnormal   Collection Time: 05/10/20  7:44 PM  Result Value Ref Range   Color, Urine YELLOW YELLOW   APPearance CLEAR CLEAR   Specific Gravity, Urine 1.005 1.005  - 1.030   pH 5.0 5.0 - 8.0   Glucose, UA NEGATIVE NEGATIVE mg/dL   Hgb urine dipstick NEGATIVE NEGATIVE   Bilirubin Urine NEGATIVE NEGATIVE   Ketones, ur 20 (A) NEGATIVE mg/dL   Protein, ur NEGATIVE NEGATIVE mg/dL   Nitrite NEGATIVE NEGATIVE   Leukocytes,Ua TRACE (A) NEGATIVE   RBC / HPF 0-5 0 - 5 RBC/hpf   WBC, UA 0-5 0 - 5 WBC/hpf   Bacteria, UA NONE SEEN NONE SEEN   Squamous Epithelial / LPF 0-5 0 - 5    Comment: Performed at [facility], [place] [address], [place], [place] [address]    CT Abdomen Pelvis W Contrast  Result Date: 05/10/2020 CLINICAL DATA:  Redness and swelling of right abdomen EXAM: CT ABDOMEN AND PELVIS WITH CONTRAST TECHNIQUE: Multidetector CT imaging of the abdomen and pelvis was performed using the standard protocol following bolus administration of intravenous contrast. CONTRAST:  157mL OMNIPAQUE IOHEXOL 300 MG/ML  SOLN COMPARISON:  03/29/2013 FINDINGS: Lower chest: Lung bases are clear. No effusions. Heart is normal size. Hepatobiliary: No focal hepatic abnormality. Gallbladder unremarkable. Pancreas: No focal abnormality or ductal dilatation. Spleen: No focal abnormality.  Normal size. Adrenals/Urinary Tract: No adrenal abnormality. No focal renal abnormality. No stones or hydronephrosis. Urinary bladder is unremarkable. Stomach/Bowel: Sigmoid diverticulosis. No active diverticulitis. Appendix is normal. Stomach and small bowel decompressed, grossly unremarkable. Postoperative changes in the stomach. Vascular/Lymphatic: Aortic atherosclerosis. No enlarged abdominal or pelvic lymph nodes. Reproductive: Prior hysterectomy.  No adnexal masses. Other: No free fluid or free air. There is a fluid collection within the right anterior abdominal wall measuring 8.3 x 5.7 cm concerning for abscess. Musculoskeletal: No acute bony abnormality. IMPRESSION: 8.3 x 5.7 cm fluid collection in the right anterior abdominal wall most compatible with abscess. No intraperitoneal extension.  Aortic atherosclerosis. Sigmoid diverticulosis. Electronically Signed   By: Rolm Baptise M.D.   On: 05/10/2020 22:55     A/P: [name] Walker is an 64 y.o. female with hx HTN, obesity, prior RYGB - hx of abdominal wall infection in setting of prior VHR/mesh - last infection was 2014 - now with likely recurrent infection and large abscess  -Agree with admission -MIVF, IV abx - broad coverage, including MRSA - NPO p MN -Tentatively plan for IR drainage of this large collection; will discuss further with Dr. Donne Hazel in AM  Sharon Mt. Dema Severin, M.D. [facility] Use [email or web address] to contact on call provider

## 2020-05-10 NOTE — ED Provider Notes (Addendum)
[facility] EMERGENCY DEPARTMENT Provider Note   CSN: [id number] Arrival date & time: 05/10/20  1517     History Chief Complaint  Patient presents with  . Abscess    [name] Walker is a 64 y.o. female.  64 year old female with complaint of 2-3 weeks of a lump to the right upper abdomen, over the past 3-4 days has tripled in size. Area is red, hard, swollen, worse with sitting/straining for bowel movements which produce severe/stabbing pain. Reports chills, nausea and 1 episodes of vomiting today. Patient has not eaten in several days due to concern for hernia. Normal bowel movements. No history diabetes. Did have ventral hernia repair in 2011, did have abdominal wall abscess in 2014 (admitted, drained by IR, IV abx).          Past Medical History:  Diagnosis Date  . History of stomach ulcers   . Hypertension   . Obesity     Patient Active Problem List   Diagnosis Date Noted  . Abdominal abscess 03/27/2013    Past Surgical History:  Procedure Laterality Date  . ABDOMINAL HYSTERECTOMY    . ABDOMINAL SURGERY     ulcers  . GASTRIC BYPASS    . HERNIA REPAIR       OB History   No obstetric history on file.     Family History  Problem Relation Age of Onset  . Heart disease Mother   . Diabetes Mother   . Heart disease Father     Social History   Tobacco Use  . Smoking status: Former Smoker    Quit date: 10/18/1998    Years since quitting: 21.5  Substance Use Topics  . Alcohol use: Yes    Comment: soc  . Drug use: No    Home Medications Prior to Admission medications   Medication Sig Start Date End Date Taking? Authorizing Provider  APPLE CIDER VINEGAR PO Take 1 capsule by mouth 2 (two) times daily.   Yes [provider]  COLLAGEN PO Take 1 capsule by mouth 3 (three) times daily.   Yes [provider]  naproxen sodium (ALEVE) 220 MG tablet Take 220 mg by mouth in the morning.   Yes [provider]  NON  FORMULARY Take 1 tablet by mouth See admin instructions. Keto Detox- Take 1 tablet by mouth two times a day   Yes [provider]  Omega-3 Fatty Acids (FISH OIL) 1200 MG CAPS Take 1,200 mg by mouth 2 (two) times daily.   Yes [provider]  Prenatal Vit w/Fe-Methylfol-FA (PNV PO) Take 1 tablet by mouth in the morning.   Yes [provider]  Probiotic Product (PROBIOTIC DAILY) CAPS Take 1 capsule by mouth in the morning.   Yes [provider]  doxycycline (VIBRAMYCIN) 50 MG capsule Take 2 capsules (100 mg total) by mouth 2 (two) times daily. Patient not taking: Reported on 05/10/2020 03/29/13   Barnetta Chapel, PA-C    Allergies    Tramadol and Latex  Review of Systems   Review of Systems  Constitutional: Positive for chills. Negative for fever.  Respiratory: Negative for shortness of breath.   Cardiovascular: Negative for chest pain.  Gastrointestinal: Positive for abdominal pain, nausea and vomiting. Negative for constipation and diarrhea.  Genitourinary: Negative for dysuria.  Musculoskeletal: Negative for arthralgias and myalgias.  Skin: Positive for rash. Negative for wound.  Allergic/Immunologic: Negative for immunocompromised state.  Neurological: Negative for weakness.  Hematological: Negative for adenopathy.  Psychiatric/Behavioral: Negative for confusion.  All other systems reviewed and are negative.   Physical Exam Updated Vital Signs BP 116/73 (BP Location: Left Arm)   Pulse 94   Temp 99.8 F (37.7 C) (Oral)   Resp 16   Ht 5\' 4"  (1.626 m)   Wt 100.7 kg   SpO2 100%   BMI 38.11 kg/m   Physical Exam Vitals and nursing note reviewed.  Constitutional:      General: She is not in acute distress.    Appearance: She is well-developed. She is obese. She is not diaphoretic.  HENT:     Head: Normocephalic and atraumatic.  Cardiovascular:     Rate and Rhythm: Normal rate and regular rhythm.     Pulses: Normal pulses.     Heart sounds:  Normal heart sounds.  Pulmonary:     Effort: Pulmonary effort is normal.     Breath sounds: Normal breath sounds.  Abdominal:     Palpations: Abdomen is soft.     Tenderness: There is abdominal tenderness in the right upper quadrant and epigastric area. There is guarding.     Comments: Approximately 6.5cm x 20cm area of erythema to right upper abdomen with small central pustule, no active drainage.   Musculoskeletal:     Right lower leg: No edema.     Left lower leg: No edema.  Skin:    General: Skin is warm and dry.     Findings: Erythema present.  Neurological:     Mental Status: She is alert and oriented to person, place, and time.  Psychiatric:        Behavior: Behavior normal.     ED Results / Procedures / Treatments   Labs (all labs ordered are listed, but only abnormal results are displayed) Labs Reviewed  COMPREHENSIVE METABOLIC PANEL - Abnormal; Notable for the following components:      Result Value   Sodium 133 (*)    Albumin 3.4 (*)    All other components within normal limits  CBC WITH DIFFERENTIAL/PLATELET - Abnormal; Notable for the following components:   Hemoglobin 11.6 (*)    MCH 25.6 (*)    Abs Immature Granulocytes 0.09 (*)    All other components within normal limits  URINALYSIS, ROUTINE W REFLEX MICROSCOPIC - Abnormal; Notable for the following components:   Ketones, ur 20 (*)    Leukocytes,Ua TRACE (*)    All other components within normal limits  CULTURE, BLOOD (SINGLE)  CULTURE, BLOOD (SINGLE)  SARS CORONAVIRUS 2 BY RT PCR (HOSPITAL ORDER, PERFORMED IN [facility])  LACTIC ACID, PLASMA    EKG None  Radiology CT Abdomen Pelvis W Contrast  Result Date: 05/10/2020 CLINICAL DATA:  Redness and swelling of right abdomen EXAM: CT ABDOMEN AND PELVIS WITH CONTRAST TECHNIQUE: Multidetector CT imaging of the abdomen and pelvis was performed using the standard protocol following bolus administration of intravenous contrast. CONTRAST:  05/12/2020  OMNIPAQUE IOHEXOL 300 MG/ML  SOLN COMPARISON:  03/29/2013 FINDINGS: Lower chest: Lung bases are clear. No effusions. Heart is normal size. Hepatobiliary: No focal hepatic abnormality. Gallbladder unremarkable. Pancreas: No focal abnormality or ductal dilatation. Spleen: No focal abnormality.  Normal size. Adrenals/Urinary Tract: No adrenal abnormality. No focal renal abnormality. No stones or hydronephrosis. Urinary bladder is unremarkable. Stomach/Bowel: Sigmoid diverticulosis. No active diverticulitis. Appendix is normal. Stomach and small bowel decompressed, grossly unremarkable. Postoperative changes in the stomach. Vascular/Lymphatic: Aortic atherosclerosis. No enlarged abdominal or pelvic lymph nodes. Reproductive: Prior hysterectomy.  No adnexal  masses. Other: No free fluid or free air. There is a fluid collection within the right anterior abdominal wall measuring 8.3 x 5.7 cm concerning for abscess. Musculoskeletal: No acute bony abnormality. IMPRESSION: 8.3 x 5.7 cm fluid collection in the right anterior abdominal wall most compatible with abscess. No intraperitoneal extension. Aortic atherosclerosis. Sigmoid diverticulosis. Electronically Signed   By: Rolm Baptise M.D.   On: 05/10/2020 22:55    Procedures Procedures (including critical care time)  Medications Ordered in ED Medications  sodium chloride flush (NS) 0.9 % injection 3 mL (3 mLs Intravenous Not Given 05/10/20 2209)  piperacillin-tazobactam (ZOSYN) IVPB 3.375 g (has no administration in time range)  sodium chloride 0.9 % bolus 1,000 mL (1,000 mLs Intravenous New Bag/Given 05/10/20 2207)  clindamycin (CLEOCIN) IVPB 600 mg (0 mg Intravenous Stopped 05/10/20 2310)  iohexol (OMNIPAQUE) 300 MG/ML solution 100 mL (100 mLs Intravenous Contrast Given 05/10/20 2231)    ED Course  I have reviewed the triage vital signs and the nursing notes.  Pertinent labs & imaging results that were available during my care of the patient were reviewed by  me and considered in my medical decision making (see chart for details).  Clinical Course as of May 11 2335  Wed Jun 16, 556  5942 64 year old female with right upper abdominal abscess, on exam, large area of erythema to URQ with significant surrounding tenderness. Patient is otherwise well appearing.  Initial vitals with tachycardia with HR 125 and oral temp 99.8, improved without intervention and recheck temp at time of exam normal. CT ordered to further evaluate abscess due to concern for boarder line fever with tachycardia, significant tenderness.  CT shows large fluid collection without intraperitoneal extension.  Patient seen by Dr. Ronnald Nian, ER attending, discussed with general surgery, Dr. Dema Severin, who is in the department and has seen the patient, recommends Zosyn, admit to hospitalist for IR to evaluate.    [LM]  2333 Discussed with Dr. Hal Hope with [facility] who will consult for admission.  Labs with normal WBC, normal lactic, no significant changes to CMP.   [LM]    Clinical Course User Index [LM] Roque Lias   MDM Rules/Calculators/A&P                          Final Clinical Impression(s) / ED Diagnoses Final diagnoses:  Abscess    Rx / DC Orders ED Discharge Orders    None       Tacy Learn, PA-C 05/10/20 2333    Tacy Learn, PA-C 05/10/20 2336    Lennice Sites, DO 05/11/20 0007

## 2020-05-10 NOTE — ED Triage Notes (Signed)
Pt arrives pov with reports of redness and swelling to R abdomen onset 1 week ago. abcess warm to the touch. Pt denies fevers, endorses NV onset today.

## 2020-05-10 NOTE — ED Provider Notes (Signed)
Medical screening examination/treatment/procedure(s) were conducted as a shared visit with non-physician practitioner(s) and myself.  I personally evaluated the patient during the encounter. Briefly, the patient is a 64 y.o. female with history of ventral hernia repair who presents to the ED with abdominal pain.  Patient mildly tachycardic upon arrival.  No fever.  Has had redness and swelling to the right side of her abdomen for the last week or 2.  Had ventral hernia repair back in 2011 and was complicated with abdominal wall abscesses in the past.  Appears likely at the same.  CT scan confirmed large abdominal wall abscess 8 x 5 cm.  Lab work showed no significant leukocytosis or anemia.  Does not appear to be septic.  Talked with Dr. Cliffton Asters with general surgery who recommends IV antibiotics and admission to medicine and likely to have IR catheter drain.  To be admitted for further care.  This chart was dictated using voice recognition software.  Despite best efforts to proofread,  errors can occur which can change the documentation meaning.     EKG Interpretation None           Virgina Norfolk, DO 05/10/20 2324

## 2020-05-11 ENCOUNTER — Encounter (HOSPITAL_COMMUNITY)
Disposition: A | Discharge status: Home / Self Care | Payer: Self-pay | Source: Home / Self Care | Attending: Internal Medicine

## 2020-05-11 ENCOUNTER — Inpatient Hospital Stay (HOSPITAL_COMMUNITY): Payer: Self-pay | Admitting: Certified Registered"

## 2020-05-11 ENCOUNTER — Encounter (HOSPITAL_COMMUNITY): Payer: Self-pay | Admitting: Internal Medicine

## 2020-05-11 DIAGNOSIS — L02211 Cutaneous abscess of abdominal wall: Principal | ICD-10-CM

## 2020-05-11 HISTORY — PX: INCISION AND DRAINAGE ABSCESS: SHX5864

## 2020-05-11 LAB — BASIC METABOLIC PANEL
Anion gap: 15 (ref 5–15)
BUN: 11 mg/dL (ref 8–23)
CO2: 19 mmol/L — ABNORMAL LOW (ref 22–32)
Calcium: 9 mg/dL (ref 8.9–10.3)
Chloride: 100 mmol/L (ref 98–111)
Creatinine, Ser: 0.78 mg/dL (ref 0.44–1.00)
GFR calc Af Amer: >60 mL/min (ref >60)
GFR calc non Af Amer: >60 mL/min (ref >60)
Glucose, Bld: 88 mg/dL (ref 70–99)
Potassium: 3.4 mmol/L — ABNORMAL LOW (ref 3.5–5.1)
Sodium: 134 mmol/L — ABNORMAL LOW (ref 135–145)

## 2020-05-11 LAB — MRSA PCR SCREENING: MRSA by PCR: NEGATIVE

## 2020-05-11 LAB — SARS CORONAVIRUS 2 BY RT PCR (HOSPITAL ORDER, PERFORMED IN ~~LOC~~ HOSPITAL LAB): SARS Coronavirus 2: NEGATIVE

## 2020-05-11 LAB — CBC
HCT: 40.8 % (ref 36.0–46.0)
Hemoglobin: 12.8 g/dL (ref 12.0–15.0)
MCH: 25.2 pg — ABNORMAL LOW (ref 26.0–34.0)
MCHC: 31.4 g/dL (ref 30.0–36.0)
MCV: 80.3 fL (ref 80.0–100.0)
Platelets: 303 10*3/uL (ref 150–400)
RBC: 5.08 MIL/uL (ref 3.87–5.11)
RDW: 13.2 % (ref 11.5–15.5)
WBC: 6.1 10*3/uL (ref 4.0–10.5)
nRBC: 0 % (ref 0.0–0.2)

## 2020-05-11 LAB — HIV ANTIBODY (ROUTINE TESTING W REFLEX): HIV Screen 4th Generation wRfx: NONREACTIVE

## 2020-05-11 SURGERY — INCISION AND DRAINAGE, ABSCESS
Anesthesia: General | Site: Abdomen

## 2020-05-11 MED ORDER — VANCOMYCIN HCL IN DEXTROSE 1-5 GM/200ML-% IV SOLN: 1000.0000 mg | Freq: Once | INTRAVENOUS | Status: DC

## 2020-05-11 MED ORDER — VANCOMYCIN HCL IN DEXTROSE 1-5 GM/200ML-% IV SOLN
1000.0000 mg | Freq: Two times a day (BID) | INTRAVENOUS | Status: DC
  Administered 2020-05-12: 1000 mg via INTRAVENOUS
  Filled 2020-05-11 (×2): qty 200

## 2020-05-11 MED ORDER — 0.9 % SODIUM CHLORIDE (POUR BTL) OPTIME
TOPICAL | Status: DC | PRN
  Administered 2020-05-11: 1000 mL

## 2020-05-11 MED ORDER — ORAL CARE MOUTH RINSE: 15.0000 mL | Freq: Once | OROMUCOSAL | Status: AC

## 2020-05-11 MED ORDER — MIDAZOLAM HCL 5 MG/5ML IJ SOLN
INTRAMUSCULAR | Status: DC | PRN
  Administered 2020-05-11: 2 mg via INTRAVENOUS

## 2020-05-11 MED ORDER — MORPHINE SULFATE (PF) 2 MG/ML IV SOLN
2.0000 mg | INTRAVENOUS | Status: DC | PRN
  Administered 2020-05-11: 2 mg via INTRAVENOUS
  Filled 2020-05-11: qty 1

## 2020-05-11 MED ORDER — MIDAZOLAM HCL 2 MG/2ML IJ SOLN
INTRAMUSCULAR | Status: AC
  Filled 2020-05-11: qty 2

## 2020-05-11 MED ORDER — ACETAMINOPHEN 10 MG/ML IV SOLN
INTRAVENOUS | Status: DC | PRN
Start: 2020-05-11 — End: 2020-05-11
  Administered 2020-05-11: 1000 mg via INTRAVENOUS

## 2020-05-11 MED ORDER — ONDANSETRON HCL 4 MG/2ML IJ SOLN
INTRAMUSCULAR | Status: DC | PRN
  Administered 2020-05-11: 4 mg via INTRAVENOUS

## 2020-05-11 MED ORDER — ACETAMINOPHEN 500 MG PO TABS: 1000.0000 mg | ORAL_TABLET | Freq: Once | ORAL | Status: DC

## 2020-05-11 MED ORDER — DEXTROSE-NACL 5-0.9 % IV SOLN: INTRAVENOUS | Status: DC

## 2020-05-11 MED ORDER — CHLORHEXIDINE GLUCONATE 0.12 % MT SOLN
15.0000 mL | Freq: Once | OROMUCOSAL | Status: AC
  Administered 2020-05-11: 15 mL via OROMUCOSAL
  Filled 2020-05-11: qty 15

## 2020-05-11 MED ORDER — MORPHINE SULFATE (PF) 4 MG/ML IV SOLN
4.0000 mg | INTRAVENOUS | Status: DC | PRN
  Administered 2020-05-11: 4 mg via INTRAVENOUS
  Filled 2020-05-11: qty 1

## 2020-05-11 MED ORDER — ONDANSETRON HCL 4 MG PO TABS: 4.0000 mg | ORAL_TABLET | Freq: Four times a day (QID) | ORAL | Status: DC | PRN

## 2020-05-11 MED ORDER — LACTATED RINGERS IV SOLN: INTRAVENOUS | Status: DC

## 2020-05-11 MED ORDER — FENTANYL CITRATE (PF) 250 MCG/5ML IJ SOLN
INTRAMUSCULAR | Status: AC
  Filled 2020-05-11: qty 5

## 2020-05-11 MED ORDER — PIPERACILLIN-TAZOBACTAM 3.375 G IVPB
3.3750 g | Freq: Three times a day (TID) | INTRAVENOUS | Status: DC
  Administered 2020-05-11 – 2020-05-13 (×7): 3.375 g via INTRAVENOUS
  Filled 2020-05-11 (×7): qty 50

## 2020-05-11 MED ORDER — ONDANSETRON HCL 4 MG/2ML IJ SOLN: 4.0000 mg | Freq: Four times a day (QID) | INTRAMUSCULAR | Status: DC | PRN

## 2020-05-11 MED ORDER — ACETAMINOPHEN 10 MG/ML IV SOLN
INTRAVENOUS | Status: AC
  Filled 2020-05-11: qty 100

## 2020-05-11 MED ORDER — POTASSIUM CHLORIDE 10 MEQ/100ML IV SOLN
10.0000 meq | Freq: Once | INTRAVENOUS | Status: AC
  Administered 2020-05-11: 10 meq via INTRAVENOUS
  Filled 2020-05-11: qty 100

## 2020-05-11 MED ORDER — KETOROLAC TROMETHAMINE 30 MG/ML IJ SOLN
30.0000 mg | Freq: Three times a day (TID) | INTRAMUSCULAR | Status: DC
  Administered 2020-05-11 – 2020-05-13 (×6): 30 mg via INTRAVENOUS
  Filled 2020-05-11 (×6): qty 1

## 2020-05-11 MED ORDER — MORPHINE SULFATE (PF) 2 MG/ML IV SOLN
1.0000 mg | INTRAVENOUS | Status: DC | PRN
  Administered 2020-05-11 (×2): 1 mg via INTRAVENOUS
  Filled 2020-05-11 (×2): qty 1

## 2020-05-11 MED ORDER — OXYCODONE HCL 5 MG PO TABS
10.0000 mg | ORAL_TABLET | ORAL | Status: DC | PRN
  Administered 2020-05-11 – 2020-05-13 (×4): 10 mg via ORAL
  Filled 2020-05-11 (×5): qty 2

## 2020-05-11 MED ORDER — PROPOFOL 10 MG/ML IV BOLUS
INTRAVENOUS | Status: AC
  Filled 2020-05-11: qty 20

## 2020-05-11 MED ORDER — LIDOCAINE 2% (20 MG/ML) 5 ML SYRINGE
INTRAMUSCULAR | Status: DC | PRN
  Administered 2020-05-11: 60 mg via INTRAVENOUS

## 2020-05-11 MED ORDER — LIDOCAINE 2% (20 MG/ML) 5 ML SYRINGE
INTRAMUSCULAR | Status: AC
  Filled 2020-05-11: qty 5

## 2020-05-11 MED ORDER — CHLORHEXIDINE GLUCONATE CLOTH 2 % EX PADS
6.0000 | MEDICATED_PAD | Freq: Every day | CUTANEOUS | Status: DC
  Administered 2020-05-11 – 2020-05-12 (×2): 6 via TOPICAL

## 2020-05-11 MED ORDER — LACTATED RINGERS IV SOLN: INTRAVENOUS | Status: DC | PRN

## 2020-05-11 MED ORDER — PROPOFOL 10 MG/ML IV BOLUS
INTRAVENOUS | Status: DC | PRN
  Administered 2020-05-11: 160 mg via INTRAVENOUS

## 2020-05-11 MED ORDER — PANTOPRAZOLE SODIUM 40 MG PO TBEC
40.0000 mg | DELAYED_RELEASE_TABLET | Freq: Every day | ORAL | Status: DC
  Administered 2020-05-11 – 2020-05-13 (×3): 40 mg via ORAL
  Filled 2020-05-11 (×3): qty 1

## 2020-05-11 MED ORDER — PIPERACILLIN-TAZOBACTAM 3.375 G IVPB 30 MIN: 3.3750 g | Freq: Once | INTRAVENOUS | Status: DC

## 2020-05-11 MED ORDER — FENTANYL CITRATE (PF) 100 MCG/2ML IJ SOLN: 50.0000 ug | INTRAMUSCULAR | Status: DC | PRN

## 2020-05-11 MED ORDER — VANCOMYCIN HCL 2000 MG/400ML IV SOLN
2000.0000 mg | Freq: Once | INTRAVENOUS | Status: AC
  Administered 2020-05-11: 2000 mg via INTRAVENOUS
  Filled 2020-05-11: qty 400

## 2020-05-11 MED ORDER — SODIUM CHLORIDE 0.9% FLUSH: 10.0000 mL | INTRAVENOUS | Status: DC | PRN

## 2020-05-11 MED ORDER — DEXAMETHASONE SODIUM PHOSPHATE 10 MG/ML IJ SOLN
INTRAMUSCULAR | Status: DC | PRN
  Administered 2020-05-11: 4 mg via INTRAVENOUS

## 2020-05-11 SURGICAL SUPPLY — 23 items
BNDG GAUZE ELAST 4 BULKY (GAUZE/BANDAGES/DRESSINGS) IMPLANT
CANISTER SUCT 3000ML PPV (MISCELLANEOUS) ×3 IMPLANT
COVER WAND RF STERILE (DRAPES) ×3 IMPLANT
DRAPE LAPAROSCOPIC ABDOMINAL (DRAPES) ×3 IMPLANT
DRSG PAD ABDOMINAL 8X10 ST (GAUZE/BANDAGES/DRESSINGS) ×3 IMPLANT
ELECT REM PT RETURN 9FT ADLT (ELECTROSURGICAL) ×3
ELECTRODE REM PT RTRN 9FT ADLT (ELECTROSURGICAL) ×1 IMPLANT
GAUZE PACKING IODOFORM 2 (PACKING) ×3 IMPLANT
GAUZE SPONGE 4X4 12PLY STRL (GAUZE/BANDAGES/DRESSINGS) ×3 IMPLANT
GLOVE BIO SURGEON STRL SZ7 (GLOVE) ×3 IMPLANT
GLOVE BIOGEL PI IND STRL 7.5 (GLOVE) ×1 IMPLANT
GLOVE BIOGEL PI INDICATOR 7.5 (GLOVE) ×2
GOWN STRL REUS W/ TWL LRG LVL3 (GOWN DISPOSABLE) ×3 IMPLANT
GOWN STRL REUS W/TWL LRG LVL3 (GOWN DISPOSABLE) ×6
KIT BASIN OR (CUSTOM PROCEDURE TRAY) ×3 IMPLANT
KIT TURNOVER KIT B (KITS) ×3 IMPLANT
NS IRRIG 1000ML POUR BTL (IV SOLUTION) ×3 IMPLANT
PACK GENERAL/GYN (CUSTOM PROCEDURE TRAY) ×3 IMPLANT
PAD ARMBOARD 7.5X6 YLW CONV (MISCELLANEOUS) ×3 IMPLANT
PENCIL SMOKE EVACUATOR (MISCELLANEOUS) ×3 IMPLANT
SWAB COLLECTION DEVICE MRSA (MISCELLANEOUS) IMPLANT
SWAB CULTURE ESWAB REG 1ML (MISCELLANEOUS) IMPLANT
TOWEL GREEN STERILE (TOWEL DISPOSABLE) ×3 IMPLANT

## 2020-05-11 NOTE — Progress Notes (Addendum)
Same day note  Patient seen and examined at bedside.  Patient was admitted to the hospital for abdominal swelling and pain.  Denies any fever chills or rigor.  Denies any nausea vomiting.  Denies shortness of breath, cough or chest pain.  At the time of my evaluation, patient complains of abdominal pain especially on movement of the abdominal wall.  Physical examination reveals tenderness of the abdominal wall with edema and erythema with superficial blister.  Laboratory data and imaging was reviewed  Assessment and Plan.  Abdominal wall abscess.  Likely MRSA infection.  General surgery has been consulted.  IR has been consulted for drain placement.  2 undergo CT-guided drain tube placement.  Continue IV antibiotics.  No leukocytosis.  T-max of 99 F.  Follow blood cultures, pending.  COVID-19 test is negative.  Continue IV vancomycin and Zosyn.  MRSA swab negative.  Normocytic anemia.  Will closely monitor with CBC.  Hyponatremia.  Mild.  Continue IV fluids.  History of gastric bypass and ventral hernia.  History of MRSA abdominal wall abscess.  No Charge  Signed,  Tenny Craw, MD [facility]

## 2020-05-11 NOTE — Progress Notes (Signed)
Patient admitted to 6N17 from ED,alert and oriented x4. C/o moderate pain to right lower abdomen. Vital signs stable. Oriented to room and remote. Paged admitting doctor.

## 2020-05-11 NOTE — Progress Notes (Signed)
Patient refused to remove contact lenses. Informed patient of the risk of corneal abrasions. Verbalized understanding. Pt stated "I assume responsibility and understand the risk"

## 2020-05-11 NOTE — Transfer of Care (Signed)
Immediate Anesthesia Transfer of Care Note  Patient: [name] Walker  Procedure(s) Performed: INCISION AND DRAINAGE ABSCESS ABDOMINAL WALL (N/A Abdomen)  Patient Location: PACU  Anesthesia Type:General  Level of Consciousness: awake  Airway & Oxygen Therapy: Patient Spontanous Breathing  Post-op Assessment: Report given to RN and Post -op Vital signs reviewed and stable  Post vital signs: Reviewed and stable  Last Vitals:  Vitals Value Taken Time  BP 107/52 05/11/20 1706  Temp    Pulse 84 05/11/20 1707  Resp 11 05/11/20 1707  SpO2 97 % 05/11/20 1707  Vitals shown include unvalidated device data.  Last Pain:  Vitals:   05/11/20 1250  TempSrc:   PainSc: 7          Complications: No complications documented.

## 2020-05-11 NOTE — Progress Notes (Signed)
Pharmacy Antibiotic Note  [name] Walker is a 64 y.o. female admitted on 05/10/2020 with abdominal wall abcess.  Pharmacy has been consulted for Vancomycin and Zosyn  dosing.  Plan: Vancomycin 2 g IV now, then 1000 mg IV q12h Zosyn 3.375 g IV q8h   Height: 5\' 4"  (162.6 cm) Weight: 100.7 kg (222 lb) IBW/kg (Calculated) : 54.7  Temp (24hrs), Avg:98.8 F (37.1 C), Min:98.1 F (36.7 C), Max:99.8 F (37.7 C)  Recent Labs  Lab 05/10/20 1624  WBC 8.1  CREATININE 0.95  LATICACIDVEN 0.9    Estimated Creatinine Clearance: 70.9 mL/min (by C-G formula based on SCr of 0.95 mg/dL).    Allergies  Allergen Reactions  . Tramadol Nausea And Vomiting  . Latex Rash and Other (See Comments)    NO POWDERED GLOVES- had to be treated with Benadryl!!    05/12/20 05/11/2020 3:01 AM

## 2020-05-11 NOTE — Progress Notes (Signed)
Patient ID: [name] Walker, female   DOB: 06/15/1956, 64 y.o.   MRN: [id number]       Subjective: Still with pain in her abdomen like last night.  No other complaints this am.  Hungry.  ROS: See above, otherwise other systems negative  Objective: Vital signs in last 24 hours: Temp:  [98.1 F (36.7 C)-99.8 F (37.7 C)] 98.7 F (37.1 C) (06/17 0557) Pulse Rate:  [82-125] 89 (06/17 0557) Resp:  [16-20] 18 (06/17 0557) BP: (105-141)/(38-73) 118/61 (06/17 0557) SpO2:  [99 %-100 %] 99 % (06/17 0557) Weight:  [100.7 kg] 100.7 kg (06/16 1617) Last BM Date: 05/10/20  Intake/Output from previous day: 06/16 0701 - 06/17 0700 In: 1444.1 [I.V.:3.3; IV Piggyback:1440.8] Out: -  Intake/Output this shift: Total I/O In: 348 [I.V.:142.7; IV Piggyback:205.4] Out: 400 [Urine:400]  PE: Heart: regular Lungs: CTAB Abd: soft, obese, right of midline incision is a orange size area of erythema and some fluctuance with a vesicle noted as well c/w abscess noted on CT.  Not tender otherwise  Lab Results:  Recent Labs    05/10/20 1624 05/11/20 0311  WBC 8.1 6.1  HGB 11.6* 12.8  HCT 37.8 40.8  PLT 369 303   BMET Recent Labs    05/10/20 1624 05/11/20 0311  NA 133* 134*  K 3.6 3.4*  CL 98 100  CO2 22 19*  GLUCOSE 98 88  BUN 13 11  CREATININE 0.95 0.78  CALCIUM 9.3 9.0   PT/INR No results for input(s): LABPROT, INR in the last 72 hours. CMP     Component Value Date/Time   NA 134 (L) 05/11/2020 0311   K 3.4 (L) 05/11/2020 0311   CL 100 05/11/2020 0311   CO2 19 (L) 05/11/2020 0311   GLUCOSE 88 05/11/2020 0311   BUN 11 05/11/2020 0311   CREATININE 0.78 05/11/2020 0311   CALCIUM 9.0 05/11/2020 0311   PROT 7.8 05/10/2020 1624   ALBUMIN 3.4 (L) 05/10/2020 1624   AST 24 05/10/2020 1624   ALT 22 05/10/2020 1624   ALKPHOS 66 05/10/2020 1624   BILITOT 0.9 05/10/2020 1624   GFRNONAA >60 05/11/2020 0311   GFRAA >60 05/11/2020 0311   Lipase  No results found for:  LIPASE     Studies/Results: CT Abdomen Pelvis W Contrast  Result Date: 05/10/2020 CLINICAL DATA:  Redness and swelling of right abdomen EXAM: CT ABDOMEN AND PELVIS WITH CONTRAST TECHNIQUE: Multidetector CT imaging of the abdomen and pelvis was performed using the standard protocol following bolus administration of intravenous contrast. CONTRAST:  159mL OMNIPAQUE IOHEXOL 300 MG/ML  SOLN COMPARISON:  03/29/2013 FINDINGS: Lower chest: Lung bases are clear. No effusions. Heart is normal size. Hepatobiliary: No focal hepatic abnormality. Gallbladder unremarkable. Pancreas: No focal abnormality or ductal dilatation. Spleen: No focal abnormality.  Normal size. Adrenals/Urinary Tract: No adrenal abnormality. No focal renal abnormality. No stones or hydronephrosis. Urinary bladder is unremarkable. Stomach/Bowel: Sigmoid diverticulosis. No active diverticulitis. Appendix is normal. Stomach and small bowel decompressed, grossly unremarkable. Postoperative changes in the stomach. Vascular/Lymphatic: Aortic atherosclerosis. No enlarged abdominal or pelvic lymph nodes. Reproductive: Prior hysterectomy.  No adnexal masses. Other: No free fluid or free air. There is a fluid collection within the right anterior abdominal wall measuring 8.3 x 5.7 cm concerning for abscess. Musculoskeletal: No acute bony abnormality. IMPRESSION: 8.3 x 5.7 cm fluid collection in the right anterior abdominal wall most compatible with abscess. No intraperitoneal extension. Aortic atherosclerosis. Sigmoid diverticulosis. Electronically Signed   By: Rolm Baptise M.D.  On: 05/10/2020 22:55    Anti-infectives: Anti-infectives (From admission, onward)   Start     Dose/Rate Route Frequency Ordered Stop   05/11/20 1800  vancomycin (VANCOCIN) IVPB 1000 mg/200 mL premix     Discontinue     1,000 mg 200 mL/hr over 60 Minutes Intravenous Every 12 hours 05/11/20 0303     05/11/20 0600  piperacillin-tazobactam (ZOSYN) IVPB 3.375 g     Discontinue      3.375 g 12.5 mL/hr over 240 Minutes Intravenous Every 8 hours 05/11/20 0303     05/11/20 0400  vancomycin (VANCOREADY) IVPB 2000 mg/400 mL        2,000 mg 200 mL/hr over 120 Minutes Intravenous  Once 05/11/20 0303 05/11/20 0620   05/11/20 0300  piperacillin-tazobactam (ZOSYN) IVPB 3.375 g  Status:  Discontinued        3.375 g 100 mL/hr over 30 Minutes Intravenous  Once 05/11/20 0258 05/11/20 0301   05/11/20 0300  vancomycin (VANCOCIN) IVPB 1000 mg/200 mL premix  Status:  Discontinued        1,000 mg 200 mL/hr over 60 Minutes Intravenous  Once 05/11/20 0258 05/11/20 0301   05/10/20 2330  piperacillin-tazobactam (ZOSYN) IVPB 3.375 g        3.375 g 100 mL/hr over 30 Minutes Intravenous  Once 05/10/20 2322 05/11/20 0104   05/10/20 2100  clindamycin (CLEOCIN) IVPB 600 mg        600 mg 100 mL/hr over 30 Minutes Intravenous  Once 05/10/20 2052 05/10/20 2310       Assessment/Plan HTN Obesity S/p VHR with mesh in 2011 with abdominal wall infection in 2014 (MRSA)  Recurrent abdominal wall abscess -agree with abx therapy to cover MRSA given prior hx.  On vanc/zosyn -IR consult pending for perc drain.  Would be ideal patient to try least invasive treatment option first.  If she fails drain management, may need I&D -will continue to follow.  -may eat after procedure or NPO p MN if unable to do procedure today.  FEN - NPO VTE - ok to start chemical ppx after IR procedure ID - [email or web address]   LOS: 0 days    Letha Cape , [facility] Surgery 05/11/2020, 10:53 AM Please see Amion for pager number during day hours 7:00am-4:30pm or 7:00am -11:30am on weekends

## 2020-05-11 NOTE — H&P (Signed)
History and Physical    [name] Walker OZD:[id number] DOB: 1956/08/25 DOA: 05/10/2020  PCP: Gretel Acre, MD  Patient coming from: Home.  Chief Complaint: Abdominal swelling and pain.  HPI: [name] Walker is a 64 y.o. female with history of gastric bypass and previous history of ventral hernia repair which was complicated with abscess formation in 2012 which required drain placement noticed small golf ball-like swelling 3 weeks ago and entry abdominal wall on the right side.  This acutely worsened over the last 3 warm to touch.  No subjective feeling of fever or chills.  ED Course: In the ER patient was afebrile.  On exam patient had large swelling on the right anterior abdominal wall with mild erythema.  CT abdomen pelvis shows an 8 x 5 cm abdominal wall abscess for which general surgery was consulted and they requested IR consult.  Blood cultures obtained and patient was started on empiric antibiotics.  Review of Systems: As per HPI, rest all negative.   Past Medical History:  Diagnosis Date  . History of stomach ulcers   . Hypertension   . Obesity     Past Surgical History:  Procedure Laterality Date  . ABDOMINAL HYSTERECTOMY    . ABDOMINAL SURGERY     ulcers  . GASTRIC BYPASS    . HERNIA REPAIR       reports that she quit smoking about 21 years ago. She has never used smokeless tobacco. She reports current alcohol use. She reports that she does not use drugs.  Allergies  Allergen Reactions  . Tramadol Nausea And Vomiting  . Latex Rash and Other (See Comments)    NO POWDERED GLOVES- had to be treated with Benadryl!!    Family History  Problem Relation Age of Onset  . Heart disease Mother   . Diabetes Mother   . Heart disease Father     Prior to Admission medications   Medication Sig Start Date End Date Taking? Authorizing Provider  APPLE CIDER VINEGAR PO Take 1 capsule by mouth 2 (two) times daily.   Yes [provider]  COLLAGEN PO Take 1 capsule  by mouth 3 (three) times daily.   Yes [provider]  naproxen sodium (ALEVE) 220 MG tablet Take 220 mg by mouth in the morning.   Yes [provider]  NON FORMULARY Take 1 tablet by mouth See admin instructions. Keto Detox- Take 1 tablet by mouth two times a day   Yes [provider]  Omega-3 Fatty Acids (FISH OIL) 1200 MG CAPS Take 1,200 mg by mouth 2 (two) times daily.   Yes [provider]  Prenatal Vit w/Fe-Methylfol-FA (PNV PO) Take 1 tablet by mouth in the morning.   Yes [provider]  Probiotic Product (PROBIOTIC DAILY) CAPS Take 1 capsule by mouth in the morning.   Yes [provider]  doxycycline (VIBRAMYCIN) 50 MG capsule Take 2 capsules (100 mg total) by mouth 2 (two) times daily. Patient not taking: Reported on 05/10/2020 03/29/13   Barnetta Chapel, PA-C    Physical Exam: Constitutional: Moderately built and nourished. Vitals:   05/10/20 1619 05/10/20 1840 05/11/20 0045 05/11/20 0125  BP: (!) 141/45 116/73 (!) 105/38 (!) 118/49  Pulse: (!) 125 94 82 84  Resp: 20 16 16    Temp: 99.8 F (37.7 C)  98.6 F (37 C) 98.1 F (36.7 C)  TempSrc: Oral   Oral  SpO2: 99% 100% 100% 100%  Weight:      Height:  Eyes: Anicteric no pallor. ENMT: No discharge from the ears eyes nose or mouth. Neck: No mass felt.  No neck rigidity. Respiratory: No rhonchi or crepitations. Cardiovascular: S1-S2 heard. Abdomen: Swelling and erythema of the right abdominal wall. Musculoskeletal: No edema. Skin: No rash. Neurologic: Alert awake oriented time place and person.  Moves all extremities. Psychiatric: Appears normal.   Labs on Admission: I have personally reviewed following labs and imaging studies  CBC: Recent Labs  Lab 05/10/20 1624  WBC 8.1  NEUTROABS 5.5  HGB 11.6*  HCT 37.8  MCV 83.3  PLT 250   Basic Metabolic Panel: Recent Labs  Lab 05/10/20 1624  NA 133*  K 3.6  CL 98  CO2 22  GLUCOSE 98  BUN 13  CREATININE  0.95  CALCIUM 9.3   GFR: Estimated Creatinine Clearance: 70.9 mL/min (by C-G formula based on SCr of 0.95 mg/dL). Liver Function Tests: Recent Labs  Lab 05/10/20 1624  AST 24  ALT 22  ALKPHOS 66  BILITOT 0.9  PROT 7.8  ALBUMIN 3.4*   No results for input(s): LIPASE, AMYLASE in the last 168 hours. No results for input(s): AMMONIA in the last 168 hours. Coagulation Profile: No results for input(s): INR, PROTIME in the last 168 hours. Cardiac Enzymes: No results for input(s): CKTOTAL, CKMB, CKMBINDEX, TROPONINI in the last 168 hours. BNP (last 3 results) No results for input(s): PROBNP in the last 8760 hours. HbA1C: No results for input(s): HGBA1C in the last 72 hours. CBG: No results for input(s): GLUCAP in the last 168 hours. Lipid Profile: No results for input(s): CHOL, HDL, LDLCALC, TRIG, CHOLHDL, LDLDIRECT in the last 72 hours. Thyroid Function Tests: No results for input(s): TSH, T4TOTAL, FREET4, T3FREE, THYROIDAB in the last 72 hours. Anemia Panel: No results for input(s): VITAMINB12, FOLATE, FERRITIN, TIBC, IRON, RETICCTPCT in the last 72 hours. Urine analysis:    Component Value Date/Time   COLORURINE YELLOW 05/10/2020 1944   APPEARANCEUR CLEAR 05/10/2020 1944   LABSPEC 1.005 05/10/2020 1944   PHURINE 5.0 05/10/2020 1944   GLUCOSEU NEGATIVE 05/10/2020 1944   HGBUR NEGATIVE 05/10/2020 1944   BILIRUBINUR NEGATIVE 05/10/2020 1944   KETONESUR 20 (A) 05/10/2020 1944   PROTEINUR NEGATIVE 05/10/2020 1944   UROBILINOGEN 1.0 03/24/2013 1855   NITRITE NEGATIVE 05/10/2020 1944   LEUKOCYTESUR TRACE (A) 05/10/2020 1944   Sepsis Labs: @LABRCNTIP (procalcitonin:4,lacticidven:4) ) Recent Results (from the past 240 hour(s))  SARS Coronavirus 2 by RT PCR (hospital order, performed in [place] hospital lab) Nasopharyngeal Nasopharyngeal Swab     Status: None   Collection Time: 05/11/20 12:05 AM   Specimen: Nasopharyngeal Swab  Result Value Ref Range Status   SARS  Coronavirus 2 NEGATIVE NEGATIVE Final    Comment: (NOTE) SARS-CoV-2 target nucleic acids are NOT DETECTED.  The SARS-CoV-2 RNA is generally detectable in upper and lower respiratory specimens during the acute phase of infection. The lowest concentration of SARS-CoV-2 viral copies this assay can detect is 250 copies / mL. A negative result does not preclude SARS-CoV-2 infection and should not be used as the sole basis for treatment or other patient management decisions.  A negative result may occur with improper specimen collection / handling, submission of specimen other than nasopharyngeal swab, presence of viral mutation(s) within the areas targeted by this assay, and inadequate number of viral copies (<250 copies / mL). A negative result must be combined with clinical observations, patient history, and epidemiological information.  Fact Sheet for Patients:   StrictlyIdeas.no  Fact Sheet  for Healthcare Providers: https://pope.com/  This test is not yet approved or  cleared by the [place] and has been authorized for detection and/or diagnosis of SARS-CoV-2 by FDA under an Emergency Use Authorization (EUA).  This EUA will remain in effect (meaning this test can be used) for the duration of the COVID-19 declaration under Section 564(b)(1) of the Act, 21 U.S.C. section 360bbb-3(b)(1), unless the authorization is terminated or revoked sooner.  Performed at [facility], [address] [place], [place] [address]      Radiological Exams on Admission: CT Abdomen Pelvis W Contrast  Result Date: 05/10/2020 CLINICAL DATA:  Redness and swelling of right abdomen EXAM: CT ABDOMEN AND PELVIS WITH CONTRAST TECHNIQUE: Multidetector CT imaging of the abdomen and pelvis was performed using the standard protocol following bolus administration of intravenous contrast. CONTRAST:  OMNIPAQUE IOHEXOL 300 MG/ML  SOLN COMPARISON:   03/29/2013 FINDINGS: Lower chest: Lung bases are clear. No effusions. Heart is normal size. Hepatobiliary: No focal hepatic abnormality. Gallbladder unremarkable. Pancreas: No focal abnormality or ductal dilatation. Spleen: No focal abnormality.  Normal size. Adrenals/Urinary Tract: No adrenal abnormality. No focal renal abnormality. No stones or hydronephrosis. Urinary bladder is unremarkable. Stomach/Bowel: Sigmoid diverticulosis. No active diverticulitis. Appendix is normal. Stomach and small bowel decompressed, grossly unremarkable. Postoperative changes in the stomach. Vascular/Lymphatic: Aortic atherosclerosis. No enlarged abdominal or pelvic lymph nodes. Reproductive: Prior hysterectomy.  No adnexal masses. Other: No free fluid or free air. There is a fluid collection within the right anterior abdominal wall measuring 8.3 x 5.7 cm concerning for abscess. Musculoskeletal: No acute bony abnormality. IMPRESSION: 8.3 x 5.7 cm fluid collection in the right anterior abdominal wall most compatible with abscess. No intraperitoneal extension. Aortic atherosclerosis. Sigmoid diverticulosis. Electronically Signed   By: Charlett Nose M.D.   On: 05/10/2020 22:55      Assessment/Plan Principal Problem:   Abdominal wall abscess    1. Abdominal wall abscess for which general surgery has been consulted.  Appreciate general surgery consult and recommendations.  General surgery has requested IR consult for drain placement.  Follow cultures presently on empiric antibiotics. 2. Normocytic normochromic anemia follow CBC may check anemia panel with next blood draw. 3. Mild hyponatremia follow metabolic panel. 4. Previous history of gastric bypass.   The patient has large abdominal wall abscess will need close monitoring for any further worsening in inpatient status.   DVT prophylaxis: SCDs for now until anticipation of drain placement with abdominal wall abscess.  Following which Lovenox may be started after  discussing with surgery. Code Status: Full code. Family Communication: Discussed with patient. Disposition Plan: Home. Consults called: General surgery and IR consult. Admission status: Inpatient.   Eduard Clos MD [facility] Pager [phone].  If 7PM-7AM, please contact night-coverage [email or web address] Password [facility]  05/11/2020, 2:58 AM

## 2020-05-11 NOTE — Anesthesia Procedure Notes (Signed)
Procedure Name: LMA Insertion Performed by: Brenyn Petrey H, CRNA Pre-anesthesia Checklist: Patient identified, Emergency Drugs available, Suction available and Patient being monitored Patient Re-evaluated:Patient Re-evaluated prior to induction Oxygen Delivery Method: Circle System Utilized Preoxygenation: Pre-oxygenation with 100% oxygen Induction Type: IV induction Ventilation: Mask ventilation without difficulty LMA: LMA inserted LMA Size: 3.0 Number of attempts: 1 Airway Equipment and Method: Bite block Placement Confirmation: positive ETCO2 Tube secured with: Tape Dental Injury: Teeth and Oropharynx as per pre-operative assessment        

## 2020-05-11 NOTE — Op Note (Addendum)
Preoperative diagnosis: abdominal wall abscess Postoperative diagnosis: saa Procedure: Incision and drainage of abdominal wall abscess Surgeon: Dr Harden Mo Anesthesia general Complications none Drains none Specimens cultures to microbiology Sponge and needle count correct dispo recovery stable  Indications: This is 30 yof who has history of VH repair with mesh by me ten years ago.  She had abdominal wall abscess in 2014 and then she returns with same. I discussed going to OR to drain this today  Procedure: After informed consent obtained patient taken to OR. She was given antibiotics. SCDs in placed. She was placed under general anesthesia without complication. She was prepped and draped in standard sterile surgical fashion. Surgical timeout performed.  I made elliptical incision over the abscess. I then entered the cavity with return of large amount of pus.  I did cultures. I then irrigated cavity. There was no hernia and this was all in abdominal wall. I packed with iodoform and then placed dressing. She tolerated well and was transferred to pacu

## 2020-05-11 NOTE — Plan of Care (Signed)
  Problem: Education: Goal: Knowledge of General Education information will improve Description Including pain rating scale, medication(s)/side effects and non-pharmacologic comfort measures Outcome: Progressing   

## 2020-05-11 NOTE — Anesthesia Preprocedure Evaluation (Addendum)
Anesthesia Evaluation  Patient identified by MRN, date of birth, ID band Patient awake    Reviewed: Allergy & Precautions, NPO status , Patient's Chart, lab work & pertinent test results  Airway Mallampati: II  TM Distance: >3 FB Neck ROM: Full    Dental no notable dental hx. (+) Dental Advisory Given   Pulmonary former smoker,    Pulmonary exam normal        Cardiovascular hypertension, Pt. on medications Normal cardiovascular exam     Neuro/Psych negative neurological ROS  negative psych ROS   GI/Hepatic negative GI ROS, Neg liver ROS,   Endo/Other  negative endocrine ROS  Renal/GU negative Renal ROS  negative genitourinary   Musculoskeletal negative musculoskeletal ROS (+)   Abdominal   Peds negative pediatric ROS (+)  Hematology negative hematology ROS (+)   Anesthesia Other Findings   Reproductive/Obstetrics negative OB ROS                            Anesthesia Physical Anesthesia Plan  ASA: III  Anesthesia Plan: General   Post-op Pain Management:    Induction: Intravenous  PONV Risk Score and Plan: 3 and Ondansetron, Dexamethasone and Midazolam  Airway Management Planned: LMA  Additional Equipment:   Intra-op Plan:   Post-operative Plan: Extubation in OR  Informed Consent: I have reviewed the patients History and Physical, chart, labs and discussed the procedure including the risks, benefits and alternatives for the proposed anesthesia with the patient or authorized representative who has indicated his/her understanding and acceptance.     Dental advisory given  Plan Discussed with: Anesthesiologist, CRNA and Surgeon  Anesthesia Plan Comments:        Anesthesia Quick Evaluation

## 2020-05-12 ENCOUNTER — Encounter (HOSPITAL_COMMUNITY): Payer: Self-pay | Admitting: General Surgery

## 2020-05-12 LAB — CBC
HCT: 31.9 % — ABNORMAL LOW (ref 36.0–46.0)
Hemoglobin: 9.8 g/dL — ABNORMAL LOW (ref 12.0–15.0)
MCH: 25.1 pg — ABNORMAL LOW (ref 26.0–34.0)
MCHC: 30.7 g/dL (ref 30.0–36.0)
MCV: 81.8 fL (ref 80.0–100.0)
Platelets: 321 10*3/uL (ref 150–400)
RBC: 3.9 MIL/uL (ref 3.87–5.11)
RDW: 13.2 % (ref 11.5–15.5)
WBC: 4.5 10*3/uL (ref 4.0–10.5)
nRBC: 0 % (ref 0.0–0.2)

## 2020-05-12 LAB — BASIC METABOLIC PANEL
Anion gap: 8 (ref 5–15)
BUN: 15 mg/dL (ref 8–23)
CO2: 22 mmol/L (ref 22–32)
Calcium: 8.8 mg/dL — ABNORMAL LOW (ref 8.9–10.3)
Chloride: 102 mmol/L (ref 98–111)
Creatinine, Ser: 1.12 mg/dL — ABNORMAL HIGH (ref 0.44–1.00)
GFR calc Af Amer: >60 mL/min (ref >60)
GFR calc non Af Amer: 53 mL/min — ABNORMAL LOW (ref >60)
Glucose, Bld: 183 mg/dL — ABNORMAL HIGH (ref 70–99)
Potassium: 3.7 mmol/L (ref 3.5–5.1)
Sodium: 132 mmol/L — ABNORMAL LOW (ref 135–145)

## 2020-05-12 LAB — MAGNESIUM: Magnesium: 1.5 mg/dL — ABNORMAL LOW (ref 1.7–2.4)

## 2020-05-12 MED ORDER — VANCOMYCIN HCL 750 MG/150ML IV SOLN
750.0000 mg | Freq: Two times a day (BID) | INTRAVENOUS | Status: DC
  Administered 2020-05-12 – 2020-05-13 (×2): 750 mg via INTRAVENOUS
  Filled 2020-05-12 (×4): qty 150

## 2020-05-12 MED ORDER — DOXYCYCLINE HYCLATE 100 MG PO TABS: 100.0000 mg | ORAL_TABLET | Freq: Two times a day (BID) | ORAL | 0 refills | Status: AC

## 2020-05-12 MED ORDER — MAGNESIUM OXIDE 400 (241.3 MG) MG PO TABS
400.0000 mg | ORAL_TABLET | Freq: Two times a day (BID) | ORAL | Status: DC
  Administered 2020-05-12 – 2020-05-13 (×3): 400 mg via ORAL
  Filled 2020-05-12 (×3): qty 1

## 2020-05-12 MED ORDER — AMOXICILLIN-POT CLAVULANATE 875-125 MG PO TABS
1.0000 | ORAL_TABLET | Freq: Two times a day (BID) | ORAL | 0 refills | Status: AC
Start: 2020-05-12 — End: 2020-06-02

## 2020-05-12 MED FILL — AMOX-CLAV 875-125 MG TABLET: 875-125 | 21 days supply | Qty: 42 | Fill #0

## 2020-05-12 MED FILL — DOXYCYCLINE HYCLATE 100 MG: 100 | 21 days supply | Qty: 42 | Fill #0

## 2020-05-12 NOTE — TOC Initial Note (Addendum)
Transition of Care [facility]) - Initial/Assessment Note    Patient Details  Name: [name] Walker MRN: [id number] Date of Birth: 1956/03/25  [facility] CM/SW Contact:    [name] Favre, RN Phone Number: 05/12/2020, 12:56 PM  Clinical Narrative:                  Spoke to patient. From home with husband. Patient is hopeful to discharge to room tomorrow after wound check. Discussed with patient, bedside nurse will teach wound care to patient and husband. Patient voiced understanding and stated they have done wet to dry dressings in the past.   Discussed looking into charity home health. Patient declined at this time, she feels her and her husband are OK without home health.   MD sent prescriptions to transitions [facility] care pharmacy. NCM entered patient into [facility], co pays per prescription roughly $3 patient can afford co pays. Asked TOC pharmacy to fill prescriptions today since they are closed on Saturdays.  PCP Is DR [name] Walker on [address]. Expected Discharge Plan: Home/Self Care Barriers to Discharge: Continued Medical Work up   Patient Goals and CMS Choice Patient states their goals for this hospitalization and ongoing recovery are:: to return to home CMS Medicare.gov Compare Post Acute Care list provided to:: Patient Choice offered to / list presented to : NA  Expected Discharge Plan and Services Expected Discharge Plan: Home/Self Care   Discharge Planning Services: CM Consult   Living arrangements for the past 2 months: Apartment                   DME Agency: NA       HH Arranged: Refused HH          Prior Living Arrangements/Services Living arrangements for the past 2 months: Apartment Lives with:: Spouse Patient language and need for interpreter reviewed:: Yes Do you feel safe going back to the place where you live?: Yes      Need for Family Participation in Patient Care: Yes (Comment) Care giver support system in place?: Yes (comment)    Criminal Activity/Legal Involvement Pertinent to Current Situation/Hospitalization: No - Comment as needed  Activities of Daily Living Home Assistive Devices/Equipment: Eyeglasses ADL Screening (condition at time of admission) Patient's cognitive ability adequate to safely complete daily activities?: Yes Is the patient deaf or have difficulty hearing?: No Does the patient have difficulty seeing, even when wearing glasses/contacts?: No Does the patient have difficulty concentrating, remembering, or making decisions?: No Patient able to express need for assistance with ADLs?: Yes Does the patient have difficulty dressing or bathing?: No Independently performs ADLs?: Yes (appropriate for developmental age) Does the patient have difficulty walking or climbing stairs?: No Weakness of Legs: None Weakness of Arms/Hands: None  Permission Sought/Granted   Permission granted to share information with : No              Emotional Assessment Appearance:: Appears stated age Attitude/Demeanor/Rapport: Engaged Affect (typically observed): Accepting Orientation: : Oriented to Self, Oriented to Place, Oriented to  Time, Oriented to Situation Alcohol / Substance Use: Not Applicable Psych Involvement: No (comment)  Admission diagnosis:  Abscess [L02.91] Abdominal wall abscess [L02.211] Patient Active Problem List   Diagnosis Date Noted  . Abdominal wall abscess 05/11/2020  . Abdominal abscess 03/27/2013   PCP:  [name] Pound, MD Pharmacy:   [facility] [place], [place] [place] [address] [place] [address] Phone: [phone] Fax: [phone]  [facility] [place], [place] - [address] [place] [address] Phone: [phone] Fax: [phone]     Social Determinants of Health (SDOH) Interventions    Readmission Risk Interventions No  flowsheet data found.

## 2020-05-12 NOTE — Plan of Care (Signed)

## 2020-05-12 NOTE — Progress Notes (Signed)
1 Day Post-Op   Subjective/Chief Complaint  Feels better,  No complaints   Objective: Vital signs in last 24 hours: Temp:  [97.4 F (36.3 C)-98.5 F (36.9 C)] 97.4 F (36.3 C) (06/18 0435) Pulse Rate:  [57-95] 57 (06/18 0435) Resp:  [14-19] 18 (06/18 0435) BP: (102-121)/(50-62) 105/55 (06/18 0435) SpO2:  [93 %-98 %] 98 % (06/18 0435) Weight:  [100.7 kg] 100.7 kg (06/17 1440) Last BM Date: 05/10/20  Intake/Output from previous day: 06/17 0701 - 06/18 0700 In: 1534.5 [P.O.:360; I.V.:650.2; IV Piggyback:524.3] Out: 1005 [Urine:1000; Blood:5] Intake/Output this shift: No intake/output data recorded.  dressing in place, no erythema, nontender  Lab Results:  Recent Labs    05/11/20 0311 05/12/20 0225  WBC 6.1 4.5  HGB 12.8 9.8*  HCT 40.8 31.9*  PLT 303 321   BMET Recent Labs    05/11/20 0311 05/12/20 0225  NA 134* 132*  K 3.4* 3.7  CL 100 102  CO2 19* 22  GLUCOSE 88 183*  BUN 11 15  CREATININE 0.78 1.12*  CALCIUM 9.0 8.8*   PT/INR No results for input(s): LABPROT, INR in the last 72 hours. ABG No results for input(s): PHART, HCO3 in the last 72 hours.  Invalid input(s): PCO2, PO2  Studies/Results: CT Abdomen Pelvis W Contrast  Result Date: 05/10/2020 CLINICAL DATA:  Redness and swelling of right abdomen EXAM: CT ABDOMEN AND PELVIS WITH CONTRAST TECHNIQUE: Multidetector CT imaging of the abdomen and pelvis was performed using the standard protocol following bolus administration of intravenous contrast. CONTRAST:  OMNIPAQUE IOHEXOL 300 MG/ML  SOLN COMPARISON:  03/29/2013 FINDINGS: Lower chest: Lung bases are clear. No effusions. Heart is normal size. Hepatobiliary: No focal hepatic abnormality. Gallbladder unremarkable. Pancreas: No focal abnormality or ductal dilatation. Spleen: No focal abnormality.  Normal size. Adrenals/Urinary Tract: No adrenal abnormality. No focal renal abnormality. No stones or hydronephrosis. Urinary bladder is unremarkable.  Stomach/Bowel: Sigmoid diverticulosis. No active diverticulitis. Appendix is normal. Stomach and small bowel decompressed, grossly unremarkable. Postoperative changes in the stomach. Vascular/Lymphatic: Aortic atherosclerosis. No enlarged abdominal or pelvic lymph nodes. Reproductive: Prior hysterectomy.  No adnexal masses. Other: No free fluid or free air. There is a fluid collection within the right anterior abdominal wall measuring 8.3 x 5.7 cm concerning for abscess. Musculoskeletal: No acute bony abnormality. IMPRESSION: 8.3 x 5.7 cm fluid collection in the right anterior abdominal wall most compatible with abscess. No intraperitoneal extension. Aortic atherosclerosis. Sigmoid diverticulosis. Electronically Signed   By: Charlett Nose M.D.   On: 05/10/2020 22:55    Anti-infectives: Anti-infectives (From admission, onward)   Start     Dose/Rate Route Frequency Ordered Stop   05/11/20 1800  vancomycin (VANCOCIN) IVPB 1000 mg/200 mL premix     Discontinue     1,000 mg 200 mL/hr over 60 Minutes Intravenous Every 12 hours 05/11/20 0303     05/11/20 0600  piperacillin-tazobactam (ZOSYN) IVPB 3.375 g     Discontinue     3.375 g 12.5 mL/hr over 240 Minutes Intravenous Every 8 hours 05/11/20 0303     05/11/20 0400  vancomycin (VANCOREADY) IVPB 2000 mg/400 mL        2,000 mg 200 mL/hr over 120 Minutes Intravenous  Once 05/11/20 0303 05/11/20 0620   05/11/20 0300  piperacillin-tazobactam (ZOSYN) IVPB 3.375 g  Status:  Discontinued        3.375 g 100 mL/hr over 30 Minutes Intravenous  Once 05/11/20 0258 05/11/20 0301   05/11/20 0300  vancomycin (VANCOCIN) IVPB 1000 mg/200  mL premix  Status:  Discontinued        1,000 mg 200 mL/hr over 60 Minutes Intravenous  Once 05/11/20 0258 05/11/20 0301   05/10/20 2330  piperacillin-tazobactam (ZOSYN) IVPB 3.375 g        3.375 g 100 mL/hr over 30 Minutes Intravenous  Once 05/10/20 2322 05/11/20 0104   05/10/20 2100  clindamycin (CLEOCIN) IVPB 600 mg        600  mg 100 mL/hr over 30 Minutes Intravenous  Once 05/10/20 2052 05/10/20 2310      Assessment/Plan: POD 1 drainage abdominal wall abscess -I think this is all separate from mesh although not sure -will change packing tomorrow -can go home with bid wet to dry dr changes tomorrow and then will set up follow up in office -will send home on broad spectrum abx and then tailor based on cultures. We will follow that up and likely send home on 3 weeks abx just due to concern over mesh but this should be well incorporated at this point and I did not see any concern at surgery or on ct of that  Rolm Bookbinder 05/12/2020

## 2020-05-12 NOTE — Progress Notes (Signed)
Pharmacy Antibiotic Note  [name] Walker is a 64 y.o. female admitted on 05/10/2020 with abdominal wall abscess s/p drainage 6/17.  Pharmacy has been consulted for pip/tazo and vancomycin dosing.  Abscess culture growing abundant GCP and GNR. Scr slight bump after surgery, will decrease vancomycin dose. Per surgery, concern for mesh infection so will do prolonged course of 3 weeks post-op.   Plan: Decrease vancomycin to 750mg  Q12hr Continue pip/tazo 3.375g Q8 hr  Monitor cultures and renal fx to determine PO abx x 3 wks per surgery (end date 06/01/20)  Height: 5\' 4"  (162.6 cm) Weight: 100.7 kg (222 lb) IBW/kg (Calculated) : 54.7  Temp (24hrs), Avg:98 F (36.7 C), Min:97.4 F (36.3 C), Max:98.5 F (36.9 C)  Recent Labs  Lab 05/10/20 1624 05/11/20 0311 05/12/20 0225  WBC 8.1 6.1 4.5  CREATININE 0.95 0.78 1.12*  LATICACIDVEN 0.9  --   --     Estimated Creatinine Clearance: 60.1 mL/min (A) (by C-G formula based on SCr of 1.12 mg/dL (H)).    Allergies  Allergen Reactions  . Tramadol Nausea And Vomiting    Antimicrobials this admission: Vanc 6/17>. Piptazo 6/17>>  Microbiology results: 6/16 Mrsa neg  6/16 Bcx ngtd 6/17 Abscess Abundant GPC, few GNR   Thank you for allowing pharmacy to be a part of this patient's care.  7/16, PharmD, BCPS, BCCP Clinical Pharmacist  Please check AMION for all [facility] Pharmacy phone numbers After 10:00 PM, call Main Pharmacy [phone]

## 2020-05-12 NOTE — Plan of Care (Signed)
  Problem: Education: Goal: Knowledge of General Education information will improve Description Including pain rating scale, medication(s)/side effects and non-pharmacologic comfort measures Outcome: Progressing   

## 2020-05-12 NOTE — Progress Notes (Addendum)
PROGRESS NOTE  [name] Walker ZOX:[id number] DOB: Sep 04, 1956 DOA: 05/10/2020 PCP: Gavin Pound, MD   LOS: 1 day   Brief narrative: As per HPI, [name] Walker is a 65 y.o. female with history of gastric bypass and previous history of ventral hernia repair which was complicated with abscess formation in 2012 which required drain placement noticed a small golf ball-like swelling for 3 weeks on the anterior abdominal wall on the right side.  This acutely worsened over the last 3 days prior to presentation with warmth to touch. In the ER, patient was afebrile.  On exam, patient had large swelling on the right anterior abdominal wall with mild erythema.  CT abdomen pelvis showed an 8 x 5 cm abdominal wall abscess for which general surgery was consulted.  Blood cultures obtained and patient was started on empiric antibiotics.  Patient was then admitted to hospital for further evaluation and treatment.  Assessment/Plan:  Principal Problem:   Abdominal wall abscess  Abdominal wall abscess.   General surgery was consulted and patient underwent incision and drainage of the abdominal wall abscess on 05/11/2020.  Culture from abscess cavity showing abundant gram-positive cocci with few gram-negative rods.  Blood cultures negative so far in 20.  History of MRSA infection.  Spoke with general surgery.  Will consider Augmentin and doxycycline for 3 weeks on discharge.  Prescription sent to transitional care pharmacy.  Surgery to assess the patient in a.m. prior to discharge.  Normocytic anemia.  Will closely monitor with CBC.  Hemoglobin of 9.8 today.  Hyponatremia.  Mild.  Sodium of 132 today.  Received IV fluids.  Will discontinue.  History of gastric bypass and ventral hernia.  History of MRSA abdominal wall abscess.  Mild hypomagnesemia.  Will continue replacement with magnesium oxide.  DVT prophylaxis: SCDs Start: 05/11/20 0257  Code Status: Full code  Family Communication: None  Status is:  Inpatient  Remains inpatient appropriate because:IV treatments appropriate due to intensity of illness or inability to take PO, Inpatient level of care appropriate due to severity of illness and Awaiting cultures, surgical follow-up   Dispo: The patient is from: Home              Anticipated d/c is to: Home              Anticipated d/c date is: Likely tomorrow in a.m.              Patient currently is not medically stable to d/c.  Consultants:  General surgery  Procedures:  Incision and drainage on 05/11/2020 by General surgery  Antibiotics:  . Vancomycin and Zosyn  Subjective: Today, patient was seen and examined at bedside.  Patient complains of feeling better with abdominal pain.  Denies any nausea vomiting fever chills.  Objective: Vitals:   05/12/20 0051 05/12/20 0435  BP: (!) 121/50 (!) 105/55  Pulse: (!) 59 (!) 57  Resp: 18 18  Temp: 97.8 F (36.6 C) (!) 97.4 F (36.3 C)  SpO2: 98% 98%    Intake/Output Summary (Last 24 hours) at 05/12/2020 0757 Last data filed at 05/12/2020 0600 Gross per 24 hour  Intake 1534.52 ml  Output 1005 ml  Net 529.52 ml   Filed Weights   05/10/20 1617 05/11/20 1440  Weight: 100.7 kg 100.7 kg   Body mass index is 38.11 kg/m.   Physical Exam:  GENERAL: Patient is alert awake and oriented. Not in obvious distress.  Obese HENT: No scleral pallor or icterus. Pupils equally reactive to  light. Oral mucosa is moist NECK: is supple, no gross swelling noted. CHEST: Clear to auscultation. No crackles or wheezes.  Diminished breath sounds bilaterally. CVS: S1 and S2 heard, no murmur. Regular rate and rhythm.  ABDOMEN: Soft, appropriately tender on palpation, covered with dressing from recent I&D.  Bowel sounds are present. EXTREMITIES: No edema.  CNS: Cranial nerves are intact. No focal motor deficits. SKIN: warm and dry without rashes.  Data Review: I have personally reviewed the following laboratory data and studies,  CBC: Recent  Labs  Lab 04-Jun-2020 1624 05/11/20 0311 05/12/20 0225  WBC 8.1 6.1 4.5  NEUTROABS 5.5  --   --   HGB 11.6* 12.8 9.8*  HCT 37.8 40.8 31.9*  MCV 83.3 80.3 81.8  PLT 369 303 321   Basic Metabolic Panel: Recent Labs  Lab Jun 04, 2020 1624 05/11/20 0311 05/12/20 0225  NA 133* 134* 132*  K 3.6 3.4* 3.7  CL 98 100 102  CO2 22 19* 22  GLUCOSE 98 88 183*  BUN 13 11 15   CREATININE 0.95 0.78 1.12*  CALCIUM 9.3 9.0 8.8*  MG  --   --  1.5*   Liver Function Tests: Recent Labs  Lab June 04, 2020 1624  AST 24  ALT 22  ALKPHOS 66  BILITOT 0.9  PROT 7.8  ALBUMIN 3.4*   No results for input(s): LIPASE, AMYLASE in the last 168 hours. No results for input(s): AMMONIA in the last 168 hours. Cardiac Enzymes: No results for input(s): CKTOTAL, CKMB, CKMBINDEX, TROPONINI in the last 168 hours. BNP (last 3 results) No results for input(s): BNP in the last 8760 hours.  ProBNP (last 3 results) No results for input(s): PROBNP in the last 8760 hours.  CBG: No results for input(s): GLUCAP in the last 168 hours. Recent Results (from the past 240 hour(s))  Culture, blood (single)     Status: None (Preliminary result)   Collection Time: June 04, 2020 10:10 PM   Specimen: BLOOD  Result Value Ref Range Status   Specimen Description BLOOD RIGHT ANTECUBITAL  Final   Special Requests   Final    BOTTLES DRAWN AEROBIC AND ANAEROBIC Blood Culture adequate volume   Culture   Final    NO GROWTH < 12 HOURS Performed at [facility], [address], [place], [place]    Report Status PENDING  Incomplete  Culture, blood (single)     Status: None (Preliminary result)   Collection Time: Jun 04, 2020 11:50 PM   Specimen: BLOOD RIGHT HAND  Result Value Ref Range Status   Specimen Description BLOOD RIGHT HAND  Final   Special Requests   Final    BOTTLES DRAWN AEROBIC AND ANAEROBIC Blood Culture results may not be optimal due to an inadequate volume of blood received in culture bottles   Culture   Final      NO GROWTH < 12 HOURS Performed at [facility], [address], [place], [place]    Report Status PENDING  Incomplete  SARS Coronavirus 2 by RT PCR (hospital order, performed in [facility] hospital lab) Nasopharyngeal Nasopharyngeal Swab     Status: None   Collection Time: 05/11/20 12:05 AM   Specimen: Nasopharyngeal Swab  Result Value Ref Range Status   SARS Coronavirus 2 NEGATIVE NEGATIVE Final    Comment: (NOTE) SARS-CoV-2 target nucleic acids are NOT DETECTED.  The SARS-CoV-2 RNA is generally detectable in upper and lower respiratory specimens during the acute phase of infection. The lowest concentration of SARS-CoV-2  viral copies this assay can detect is 250 copies / mL. A negative result does not preclude SARS-CoV-2 infection and should not be used as the sole basis for treatment or other patient management decisions.  A negative result may occur with improper specimen collection / handling, submission of specimen other than nasopharyngeal swab, presence of viral mutation(s) within the areas targeted by this assay, and inadequate number of viral copies (<250 copies / mL). A negative result must be combined with clinical observations, patient history, and epidemiological information.  Fact Sheet for Patients:   BoilerBrush.com.cy  Fact Sheet for Healthcare Providers: https://pope.com/  This test is not yet approved or  cleared by the [place] FDA and has been authorized for detection and/or diagnosis of SARS-CoV-2 by FDA under an Emergency Use Authorization (EUA).  This EUA will remain in effect (meaning this test can be used) for the duration of the COVID-19 declaration under Section 564(b)(1) of the Act, 21 U.S.C. section 360bbb-3(b)(1), unless the authorization is terminated or revoked sooner.  Performed at [facility], [address] [place], [place] [address]   MRSA PCR Screening      Status: None   Collection Time: 05/11/20  1:39 AM   Specimen: Nasal Mucosa; Nasopharyngeal  Result Value Ref Range Status   MRSA by PCR NEGATIVE NEGATIVE Final    Comment:        The GeneXpert MRSA Assay (FDA approved for NASAL specimens only), is one component of a comprehensive MRSA colonization surveillance program. It is not intended to diagnose MRSA infection nor to guide or monitor treatment for MRSA infections. Performed at [facility], [address], [place], [place] [address]   Aerobic/Anaerobic Culture (surgical/deep wound)     Status: None (Preliminary result)   Collection Time: 05/11/20  4:14 PM   Specimen: PATH Other; Tissue  Result Value Ref Range Status   Specimen Description ABSCESS ABDOMEN  Final   Special Requests PT ON ZOSYN ABDOMINAL WALL  Final   Gram Stain   Final    ABUNDANT WBC PRESENT, PREDOMINANTLY PMN ABUNDANT GRAM POSITIVE COCCI FEW GRAM NEGATIVE RODS Performed at [facility], [address] [place], [place] [address]    Culture MODERATE GRAM NEGATIVE RODS  Final   Report Status PENDING  Incomplete     Studies: CT Abdomen Pelvis W Contrast  Result Date: 05/10/2020 CLINICAL DATA:  Redness and swelling of right abdomen EXAM: CT ABDOMEN AND PELVIS WITH CONTRAST TECHNIQUE: Multidetector CT imaging of the abdomen and pelvis was performed using the standard protocol following bolus administration of intravenous contrast. CONTRAST:  OMNIPAQUE IOHEXOL 300 MG/ML  SOLN COMPARISON:  03/29/2013 FINDINGS: Lower chest: Lung bases are clear. No effusions. Heart is normal size. Hepatobiliary: No focal hepatic abnormality. Gallbladder unremarkable. Pancreas: No focal abnormality or ductal dilatation. Spleen: No focal abnormality.  Normal size. Adrenals/Urinary Tract: No adrenal abnormality. No focal renal abnormality. No stones or hydronephrosis. Urinary bladder is unremarkable. Stomach/Bowel: Sigmoid diverticulosis. No active diverticulitis.  Appendix is normal. Stomach and small bowel decompressed, grossly unremarkable. Postoperative changes in the stomach. Vascular/Lymphatic: Aortic atherosclerosis. No enlarged abdominal or pelvic lymph nodes. Reproductive: Prior hysterectomy.  No adnexal masses. Other: No free fluid or free air. There is a fluid collection within the right anterior abdominal wall measuring 8.3 x 5.7 cm concerning for abscess. Musculoskeletal: No acute bony abnormality. IMPRESSION: 8.3 x 5.7 cm fluid collection in the right anterior abdominal wall most compatible with abscess. No intraperitoneal extension.  Aortic atherosclerosis. Sigmoid diverticulosis. Electronically Signed   By: Charlett Nose M.D.   On: 05/10/2020 22:55      Joycelyn Das, MD  [facility] 05/12/2020

## 2020-05-12 NOTE — Anesthesia Postprocedure Evaluation (Signed)
Anesthesia Post Note  Patient: [name] Walker  Procedure(s) Performed: INCISION AND DRAINAGE ABSCESS ABDOMINAL WALL (N/A Abdomen)     Patient location during evaluation: PACU Anesthesia Type: General Level of consciousness: sedated Pain management: pain level controlled Vital Signs Assessment: post-procedure vital signs reviewed and stable Respiratory status: spontaneous breathing and respiratory function stable Cardiovascular status: stable Postop Assessment: no apparent nausea or vomiting Anesthetic complications: no   No complications documented.  Last Vitals:  Vitals:   05/12/20 0051 05/12/20 0435  BP: (!) 121/50 (!) 105/55  Pulse: (!) 59 (!) 57  Resp: 18 18  Temp: 36.6 C (!) 36.3 C  SpO2: 98% 98%    Last Pain:  Vitals:   05/12/20 0435  TempSrc: Oral  PainSc:    Pain Goal:                   Remonia Richter

## 2020-05-13 NOTE — Discharge Summary (Signed)
Physician Discharge Summary  [name] Walker WUJ:[id number] DOB: 09/11/1956 DOA: 05/10/2020  PCP: Gretel AcreNnodi, Adaku, MD  Admit date: 05/10/2020 Discharge date: 05/13/2020  Admitted From: Home  Discharge disposition: Home   Recommendations for Outpatient Follow-Up:   . Follow up with your primary care provider in one week.  . Check CBC, BMP,mg in the next visit . Follow-up with general surgery in 2 weeks   Discharge Diagnosis:   Principal Problem:   Abdominal wall abscess   Discharge Condition: Improved.  Diet recommendation:  Regular.  Wound care: Dressing as advised by surgery   Code status: Full.   History of Present Illness:   [name] Walker a 64 y.o.femalewithhistory of gastric bypass and previous history of ventral hernia repair which was complicated with abscess formation in 2012 which required drain placement noticed a small golf ball-like swelling for 3 weeks on the anterior abdominal wall on the right side. This acutely worsened over 3 days prior to presentation with warmth to touch. In the ER, patient was afebrile. She had a large swelling on the right anterior abdominal wall with mild erythema. CT abdomen pelvis showed an 8 x 5 cm abdominal wall abscess for which general surgery was consulted. Blood cultures were obtained and patient was started on empiric antibiotics.  Patient was then admitted to hospital for further evaluation and treatment.   Hospital Course:   Following conditions were addressed during hospitalization as listed below,  Abdominal wall abscess.   General surgery was consulted and patient underwent incision and drainage of the abdominal wall abscess on 05/11/2020.    Fluid culture from abscess cavity showing abundant gram-positive cocci with few gram-negative rods.  Blood cultures negative so far in 2 days. History of MRSA infection.   After discussing with surgery, patient will be prescribed Augmentin and doxycycline for 3 weeks on  discharge.  Prescription sent to transitional care pharmacy.    Normocytic anemia.    No evidence of bleeding.  Latest hemoglobin was 9.8.   Hyponatremia.  Mild.    Latest serum sodium of 132. Received IV fluids.  Will need outpatient monitoring with BMP.  History of gastric bypass and ventral hernia.  History of MRSA abdominal wall abscess.  Mild hypomagnesemia.  Replenished with magnesium oxide  Disposition.  At this time, patient is stable for disposition home.  Patient will need to follow-up with primary care physician as outpatient.  Medical Consultants:    General surgery  Procedures:     Incision and drainage on 05/11/2020 by General surgery   Subjective:   Today, patient feels significantly better.  Denies any overt abdominal pain.  Wishes to go home.  Discharge Exam:   Vitals:   05/12/20 2025 05/13/20 0436  BP: (!) 109/55 (!) 106/58  Pulse: (!) 59 69  Resp: 18 16  Temp: 98 F (36.7 C) 97.6 F (36.4 C)  SpO2: 100% 100%   Vitals:   05/12/20 0435 05/12/20 1554 05/12/20 2025 05/13/20 0436  BP: (!) 105/55 (!) 118/40 (!) 109/55 (!) 106/58  Pulse: (!) 57 60 (!) 59 69  Resp: 18 18 18 16   Temp: (!) 97.4 F (36.3 C) 97.8 F (36.6 C) 98 F (36.7 C) 97.6 F (36.4 C)  TempSrc: Oral Oral Oral Oral  SpO2: 98% 100% 100% 100%  Weight:      Height:        General: Alert awake, not in obvious distress, HENT: pupils equally reacting to light,  No scleral pallor or icterus noted.  Oral mucosa is moist.  Chest:  Clear breath sounds.  Diminished breath sounds bilaterally. No crackles or wheezes.  CVS: S1 &S2 heard. No murmur.  Regular rate and rhythm. Abdomen: Soft, covered with dressing from recent I&D,.   Extremities: No cyanosis, clubbing or edema.  Peripheral pulses are palpable. Psych: Alert, awake and oriented, normal mood CNS:  No cranial nerve deficits.  Power equal in all extremities.   Skin: Warm and dry.    The results of significant diagnostics from  this hospitalization (including imaging, microbiology, ancillary and laboratory) are listed below for reference.     Diagnostic Studies:   CT Abdomen Pelvis W Contrast  Result Date: 05/10/2020 CLINICAL DATA:  Redness and swelling of right abdomen EXAM: CT ABDOMEN AND PELVIS WITH CONTRAST TECHNIQUE: Multidetector CT imaging of the abdomen and pelvis was performed using the standard protocol following bolus administration of intravenous contrast. CONTRAST:  OMNIPAQUE IOHEXOL 300 MG/ML  SOLN COMPARISON:  03/29/2013 FINDINGS: Lower chest: Lung bases are clear. No effusions. Heart is normal size. Hepatobiliary: No focal hepatic abnormality. Gallbladder unremarkable. Pancreas: No focal abnormality or ductal dilatation. Spleen: No focal abnormality.  Normal size. Adrenals/Urinary Tract: No adrenal abnormality. No focal renal abnormality. No stones or hydronephrosis. Urinary bladder is unremarkable. Stomach/Bowel: Sigmoid diverticulosis. No active diverticulitis. Appendix is normal. Stomach and small bowel decompressed, grossly unremarkable. Postoperative changes in the stomach. Vascular/Lymphatic: Aortic atherosclerosis. No enlarged abdominal or pelvic lymph nodes. Reproductive: Prior hysterectomy.  No adnexal masses. Other: No free fluid or free air. There is a fluid collection within the right anterior abdominal wall measuring 8.3 x 5.7 cm concerning for abscess. Musculoskeletal: No acute bony abnormality. IMPRESSION: 8.3 x 5.7 cm fluid collection in the right anterior abdominal wall most compatible with abscess. No intraperitoneal extension. Aortic atherosclerosis. Sigmoid diverticulosis. Electronically Signed   By: Charlett Nose M.D.   On: 05/10/2020 22:55     Labs:   Basic Metabolic Panel: Recent Labs  Lab 05/10/20 1624 05/10/20 1624 05/11/20 0311 05/12/20 0225  NA 133*  --  134* 132*  K 3.6   < > 3.4* 3.7  CL 98  --  100 102  CO2 22  --  19* 22  GLUCOSE 98  --  88 183*  BUN 13  --  11 15   CREATININE 0.95  --  0.78 1.12*  CALCIUM 9.3  --  9.0 8.8*  MG  --   --   --  1.5*   < > = values in this interval not displayed.   GFR Estimated Creatinine Clearance: 60.1 mL/min (A) (by C-G formula based on SCr of 1.12 mg/dL (H)). Liver Function Tests: Recent Labs  Lab 05/10/20 1624  AST 24  ALT 22  ALKPHOS 66  BILITOT 0.9  PROT 7.8  ALBUMIN 3.4*   No results for input(s): LIPASE, AMYLASE in the last 168 hours. No results for input(s): AMMONIA in the last 168 hours. Coagulation profile No results for input(s): INR, PROTIME in the last 168 hours.  CBC: Recent Labs  Lab 05/10/20 1624 05/11/20 0311 05/12/20 0225  WBC 8.1 6.1 4.5  NEUTROABS 5.5  --   --   HGB 11.6* 12.8 9.8*  HCT 37.8 40.8 31.9*  MCV 83.3 80.3 81.8  PLT 369 303 321   Cardiac Enzymes: No results for input(s): CKTOTAL, CKMB, CKMBINDEX, TROPONINI in the last 168 hours. BNP: Invalid input(s): POCBNP CBG: No results for input(s): GLUCAP in the last 168 hours. D-Dimer No results for input(s):  DDIMER in the last 72 hours. Hgb A1c No results for input(s): HGBA1C in the last 72 hours. Lipid Profile No results for input(s): CHOL, HDL, LDLCALC, TRIG, CHOLHDL, LDLDIRECT in the last 72 hours. Thyroid function studies No results for input(s): TSH, T4TOTAL, T3FREE, THYROIDAB in the last 72 hours.  Invalid input(s): FREET3 Anemia work up No results for input(s): VITAMINB12, FOLATE, FERRITIN, TIBC, IRON, RETICCTPCT in the last 72 hours. Microbiology Recent Results (from the past 240 hour(s))  Culture, blood (single)     Status: None (Preliminary result)   Collection Time: 05/10/20 10:10 PM   Specimen: BLOOD  Result Value Ref Range Status   Specimen Description BLOOD RIGHT ANTECUBITAL  Final   Special Requests   Final    BOTTLES DRAWN AEROBIC AND ANAEROBIC Blood Culture adequate volume   Culture   Final    NO GROWTH 2 DAYS Performed at [facility], [address] [place], [place] [address]     Report Status PENDING  Incomplete  Culture, blood (single)     Status: None (Preliminary result)   Collection Time: 05/10/20 11:50 PM   Specimen: BLOOD RIGHT HAND  Result Value Ref Range Status   Specimen Description BLOOD RIGHT HAND  Final   Special Requests   Final    BOTTLES DRAWN AEROBIC AND ANAEROBIC Blood Culture results may not be optimal due to an inadequate volume of blood received in culture bottles   Culture   Final    NO GROWTH 2 DAYS Performed at [facility], [place] [address] [place], [place] [address]    Report Status PENDING  Incomplete  SARS Coronavirus 2 by RT PCR (hospital order, performed in [facility] hospital lab) Nasopharyngeal Nasopharyngeal Swab     Status: None   Collection Time: 05/11/20 12:05 AM   Specimen: Nasopharyngeal Swab  Result Value Ref Range Status   SARS Coronavirus 2 NEGATIVE NEGATIVE Final    Comment: (NOTE) SARS-CoV-2 target nucleic acids are NOT DETECTED.  The SARS-CoV-2 RNA is generally detectable in upper and lower respiratory specimens during the acute phase of infection. The lowest concentration of SARS-CoV-2 viral copies this assay can detect is 250 copies / mL. A negative result does not preclude SARS-CoV-2 infection and should not be used as the sole basis for treatment or other patient management decisions.  A negative result may occur with improper specimen collection / handling, submission of specimen other than nasopharyngeal swab, presence of viral mutation(s) within the areas targeted by this assay, and inadequate number of viral copies (<250 copies / mL). A negative result must be combined with clinical observations, patient history, and epidemiological information.  Fact Sheet for Patients:   StrictlyIdeas.no  Fact Sheet for Healthcare Providers: BankingDealers.co.za  This test is not yet approved or  cleared by the [place] FDA and has been authorized for  detection and/or diagnosis of SARS-CoV-2 by FDA under an Emergency Use Authorization (EUA).  This EUA will remain in effect (meaning this test can be used) for the duration of the COVID-19 declaration under Section 564(b)(1) of the Act, 21 U.S.C. section 360bbb-3(b)(1), unless the authorization is terminated or revoked sooner.  Performed at [facility], [place] [address] [place],  [address]   MRSA PCR Screening     Status: None   Collection Time: 05/11/20  1:39 AM   Specimen: Nasal Mucosa; Nasopharyngeal  Result Value Ref Range Status   MRSA by PCR NEGATIVE NEGATIVE Final    Comment:  The GeneXpert MRSA Assay (FDA approved for NASAL specimens only), is one component of a comprehensive MRSA colonization surveillance program. It is not intended to diagnose MRSA infection nor to guide or monitor treatment for MRSA infections. Performed at [facility], [address] [place], [place] [address]   Aerobic/Anaerobic Culture (surgical/deep wound)     Status: None (Preliminary result)   Collection Time: 05/11/20  4:14 PM   Specimen: PATH Other; Tissue  Result Value Ref Range Status   Specimen Description ABSCESS ABDOMEN  Final   Special Requests PT ON ZOSYN ABDOMINAL WALL  Final   Gram Stain   Final    ABUNDANT WBC PRESENT, PREDOMINANTLY PMN ABUNDANT GRAM POSITIVE COCCI FEW GRAM NEGATIVE RODS Performed at [facility], [address] [place], [place] [address]    Culture MODERATE GRAM NEGATIVE RODS  Final   Report Status PENDING  Incomplete     Discharge Instructions:   Discharge Instructions    Diet - low sodium heart healthy   Complete by: As directed    Discharge instructions   Complete by: As directed    Follow-up with your primary care physician in 1 week.  Continue with wet to dry dressing of the area.  You will have to follow-up with surgery office ( Dr. Dwain Sarna )in two weeks (call office for appointment). Ok to take tylenol or over  the counter motrin for pain with food as necessary.  Continue antibiotics as prescribed.   Discharge wound care:   Complete by: As directed    bid wet to dry dressing changes  follow up in office   Increase activity slowly   Complete by: As directed      Allergies as of 05/13/2020      Reactions   Tramadol Nausea And Vomiting      Medication List    TAKE these medications   amoxicillin-clavulanate 875-125 MG tablet Commonly known as: Augmentin Take 1 tablet by mouth 2 (two) times daily for 21 days.   APPLE CIDER VINEGAR PO Take 1 capsule by mouth 2 (two) times daily.   COLLAGEN PO Take 1 capsule by mouth 3 (three) times daily.   doxycycline 100 MG tablet Commonly known as: VIBRA-TABS Take 1 tablet (100 mg total) by mouth 2 (two) times daily for 21 days.   Fish Oil 1200 MG Caps Take 1,200 mg by mouth 2 (two) times daily.   naproxen sodium 220 MG tablet Commonly known as: ALEVE Take 220 mg by mouth in the morning.   NON FORMULARY Take 1 tablet by mouth See admin instructions. Keto Detox- Take 1 tablet by mouth two times a day   PNV PO Take 1 tablet by mouth in the morning.   Probiotic Daily Caps Take 1 capsule by mouth in the morning.            Discharge Care Instructions  (From admission, onward)         Start     Ordered   05/13/20 0000  Discharge wound care:       Comments: bid wet to dry dressing changes  follow up in office   05/13/20 1157          Follow-up Information    Ileana Ladd, MD. Schedule an appointment as soon as possible for a visit.   Specialty: Family Medicine Contact information: [address] [place] [address] [phone]        Emelia Loron, MD. Schedule an appointment  as soon as possible for a visit in 2 week(s).   Specialty: General Surgery Why: for abdominal wound followup Contact information: [address] [place] [address] [phone]                Time coordinating  discharge: 39 minutes  Signed:  Collie Wernick  [facility] 05/13/2020, 12:00 PM

## 2020-05-13 NOTE — Progress Notes (Addendum)
2 Days Post-Op   Subjective/Chief Complaint  Feels better,  No complaints   Objective: Vital signs in last 24 hours: Temp:  [97.6 F (36.4 C)-98 F (36.7 C)] 97.6 F (36.4 C) (06/19 0436) Pulse Rate:  [59-69] 69 (06/19 0436) Resp:  [16-18] 16 (06/19 0436) BP: (106-118)/(40-58) 106/58 (06/19 0436) SpO2:  [100 %] 100 % (06/19 0436) Last BM Date: 05/10/20  Intake/Output from previous day: 06/18 0701 - 06/19 0700 In: 1684.9 [P.O.:1600; IV Piggyback:84.9] Out: -  Intake/Output this shift: Total I/O In: 240 [P.O.:240] Out: -   dressing in place, no erythema, nontender  Lab Results:  Recent Labs    05/11/20 0311 05/12/20 0225  WBC 6.1 4.5  HGB 12.8 9.8*  HCT 40.8 31.9*  PLT 303 321   BMET Recent Labs    05/11/20 0311 05/12/20 0225  NA 134* 132*  K 3.4* 3.7  CL 100 102  CO2 19* 22  GLUCOSE 88 183*  BUN 11 15  CREATININE 0.78 1.12*  CALCIUM 9.0 8.8*   PT/INR No results for input(s): LABPROT, INR in the last 72 hours. ABG No results for input(s): PHART, HCO3 in the last 72 hours.  Invalid input(s): PCO2, PO2  Studies/Results: No results found.  Anti-infectives: Anti-infectives (From admission, onward)   Start     Dose/Rate Route Frequency Ordered Stop   05/12/20 1800  vancomycin (VANCOREADY) IVPB 750 mg/150 mL     Discontinue     750 mg 150 mL/hr over 60 Minutes Intravenous Every 12 hours 05/12/20 1100     05/12/20 0000  amoxicillin-clavulanate (AUGMENTIN) 875-125 MG tablet     Discontinue     1 tablet Oral 2 times daily 05/12/20 1241 06/02/20 2359   05/12/20 0000  doxycycline (VIBRA-TABS) 100 MG tablet     Discontinue     100 mg Oral 2 times daily 05/12/20 1241 06/02/20 2359   05/11/20 1800  vancomycin (VANCOCIN) IVPB 1000 mg/200 mL premix  Status:  Discontinued        1,000 mg 200 mL/hr over 60 Minutes Intravenous Every 12 hours 05/11/20 0303 05/12/20 1100   05/11/20 0600  piperacillin-tazobactam (ZOSYN) IVPB 3.375 g     Discontinue     3.375  g 12.5 mL/hr over 240 Minutes Intravenous Every 8 hours 05/11/20 0303     05/11/20 0400  vancomycin (VANCOREADY) IVPB 2000 mg/400 mL        2,000 mg 200 mL/hr over 120 Minutes Intravenous  Once 05/11/20 0303 05/11/20 0620   05/11/20 0300  piperacillin-tazobactam (ZOSYN) IVPB 3.375 g  Status:  Discontinued        3.375 g 100 mL/hr over 30 Minutes Intravenous  Once 05/11/20 0258 05/11/20 0301   05/11/20 0300  vancomycin (VANCOCIN) IVPB 1000 mg/200 mL premix  Status:  Discontinued        1,000 mg 200 mL/hr over 60 Minutes Intravenous  Once 05/11/20 0258 05/11/20 0301   05/10/20 2330  piperacillin-tazobactam (ZOSYN) IVPB 3.375 g        3.375 g 100 mL/hr over 30 Minutes Intravenous  Once 05/10/20 2322 05/11/20 0104   05/10/20 2100  clindamycin (CLEOCIN) IVPB 600 mg        600 mg 100 mL/hr over 30 Minutes Intravenous  Once 05/10/20 2052 05/10/20 2310      Assessment/Plan: POD 1 drainage abdominal wall abscess  On cultures - gram + cocci and gram neg rods - id pending  - changed packing - I showed her how to do this  -  bid wet to dry dr changes - she needs to call office for 2 week appointement  - will send home on broad spectrum abx and then tailor based on cultures.    Dr. Dwain Sarna has left prescriptions for her for the antibiotics (Augmentin and Doxycycline)  [name] Walker 05/13/2020

## 2020-05-13 NOTE — Discharge Instructions (Signed)
[facility] - DISCHARGE INSTRUCTIONS TO PATIENT  Activity:  Driving - May drive when pain controlled  Wound Care:   Change packing twice a day.  You may shower after removing packing.  Diet:  As tolerated  Follow up appointment:  Call Dr. Doreen Salvage office [facility]) at [phone] for an appointment in 2 weeks..  Medications and dosages:  Resume your home medications.             You may also take Tylenol, ibuprofen, or Aleve for pain  Call Dr. Dwain Sarna or his office  [phone]) if you have:  Temperature greater than 100.4,  Any other questions or concerns you may have after discharge.  In an emergency, call 911 or go to an Emergency Department at a nearby hospital.

## 2020-05-14 LAB — AEROBIC/ANAEROBIC CULTURE W GRAM STAIN (SURGICAL/DEEP WOUND)

## 2020-05-16 LAB — CULTURE, BLOOD (SINGLE)
Culture: NO GROWTH
Culture: NO GROWTH
Special Requests: ADEQUATE

## 2020-09-26 IMAGING — CT CT ABD-PELV W/ CM
2 of 3 series · 15 of 42 positions shown, 19 images · IV contrast (omnipaque)
Comparison: 03/29/2013

CLINICAL DATA: Redness and swelling of right abdomen

EXAM:
CT ABDOMEN AND PELVIS WITH CONTRAST
TECHNIQUE: Multidetector CT imaging of the abdomen and pelvis was performed
using the standard protocol following bolus administration of
intravenous contrast.
CONTRAST:  100mL OMNIPAQUE IOHEXOL 300 MG/ML  SOLN

[Series 3: abd/ pelvis 5.0 i30f 2 · axial · 0.98mm/px · z∈[+895,+1335]mm · 12 of 102 slices shown, 16 images]
[im 9/102  soft-tissue]
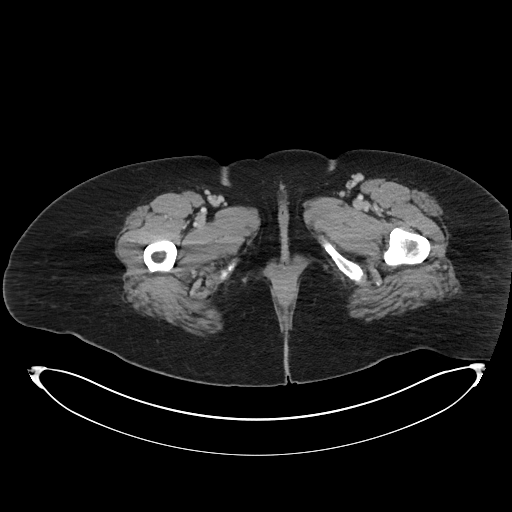
[im 9/102  bone]
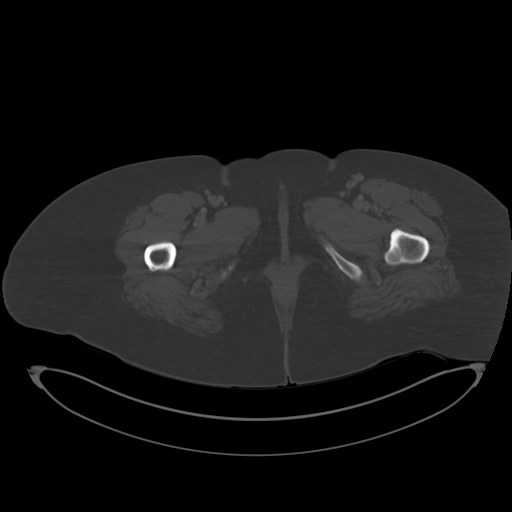
[im 17/102  soft-tissue]
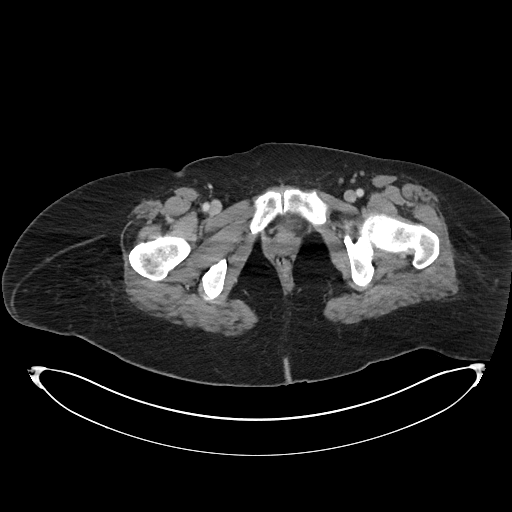
[im 26/102  soft-tissue]
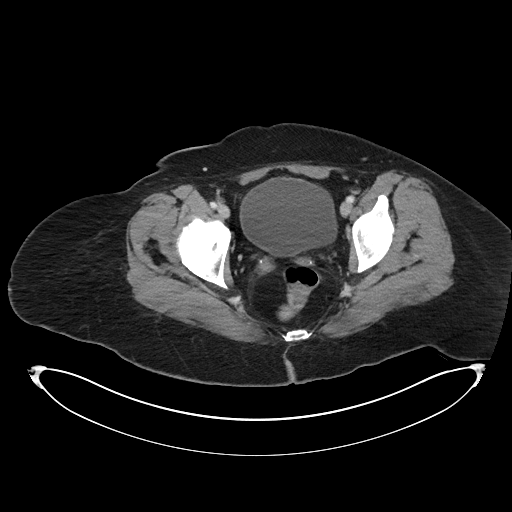
[im 38/102  soft-tissue]
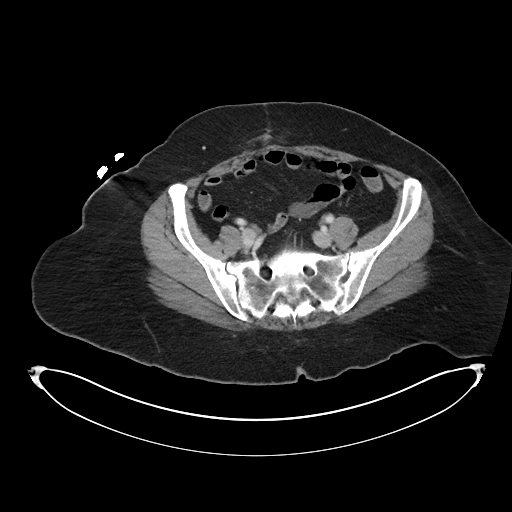
[im 47/102  soft-tissue]
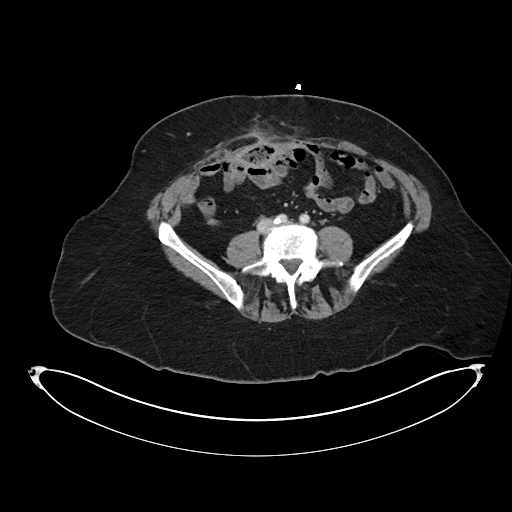
[im 55/102  soft-tissue]
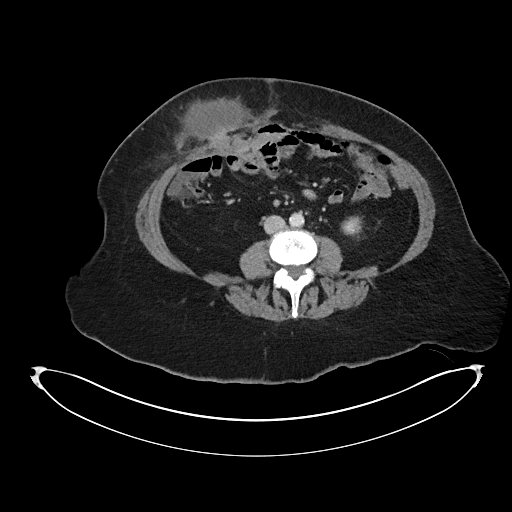
[im 64/102  soft-tissue]
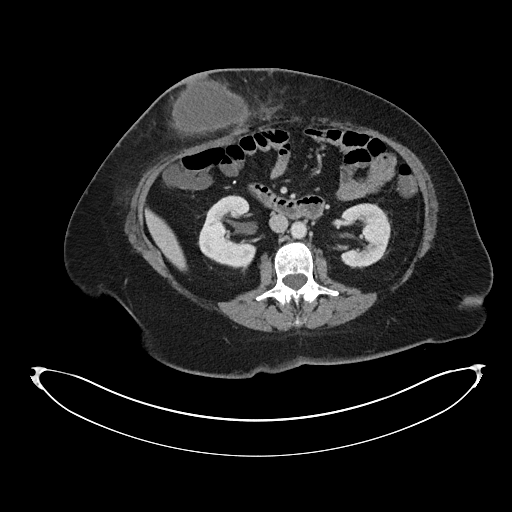
[im 76/102  soft-tissue]
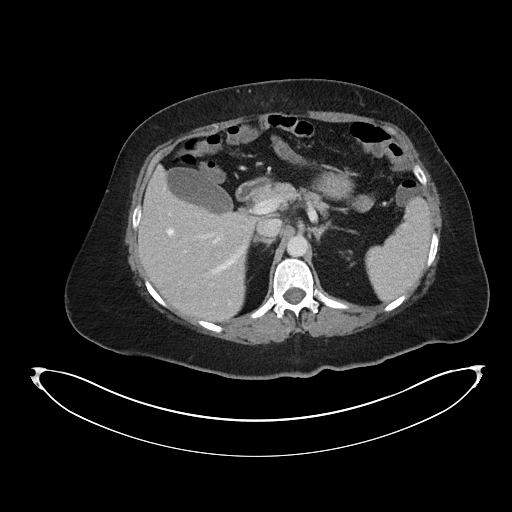
[im 85/102  soft-tissue]
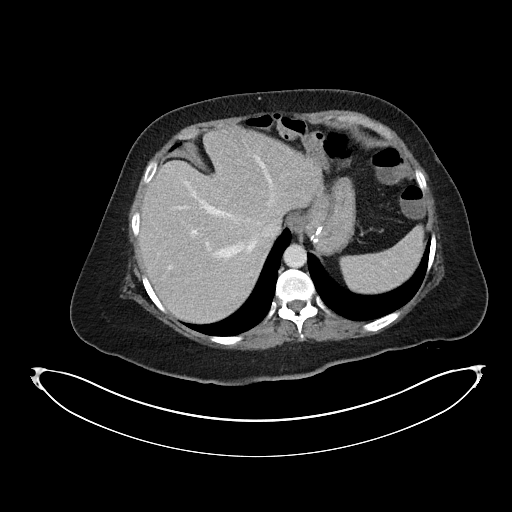
[im 85/102  lung]
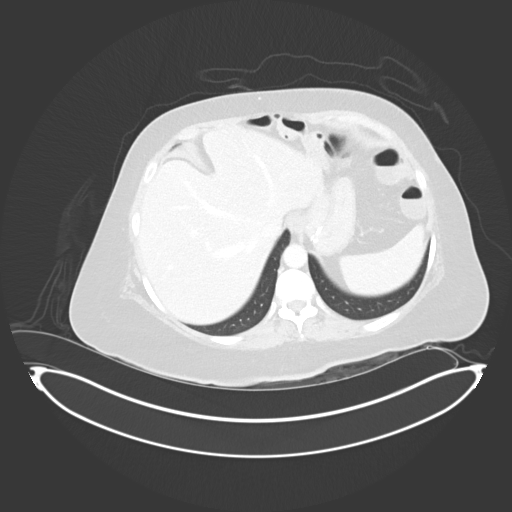
[im 85/102  bone]
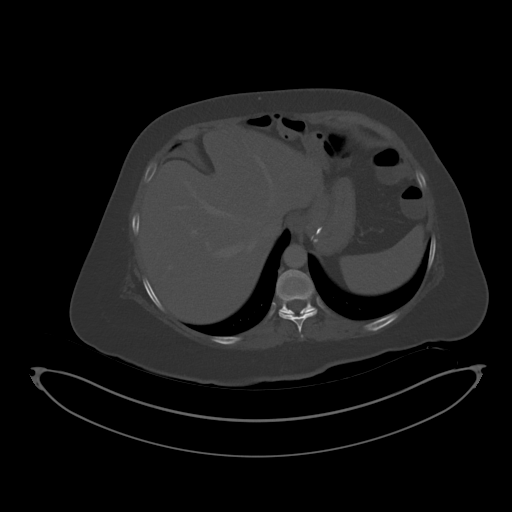
[im 89/102  lung]
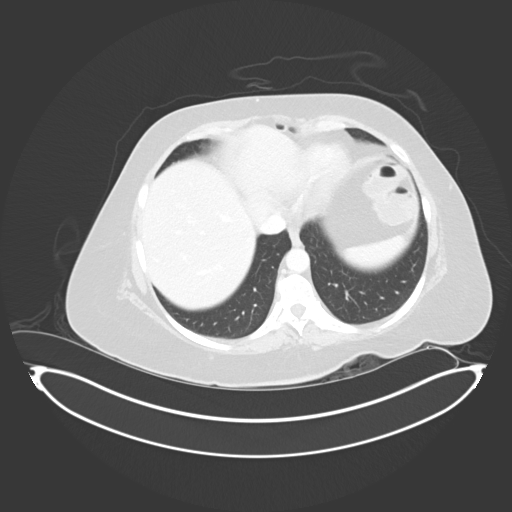
[im 93/102  soft-tissue]
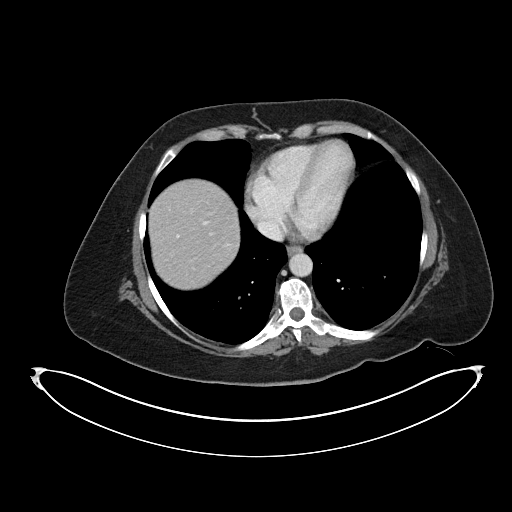
[im 93/102  lung]
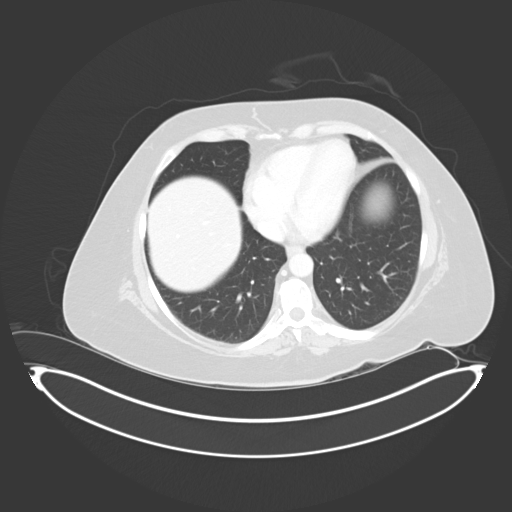
[im 97/102  lung]
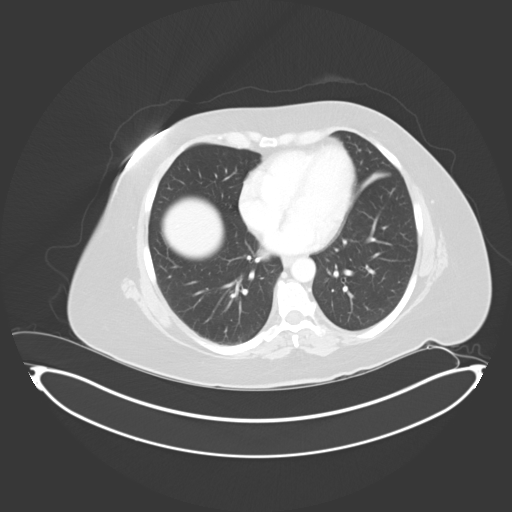

[Series 6: coronal soft tissue · coronal · 1.00mm/px · 3 of 117 slices shown]
[im 39/117  soft-tissue]
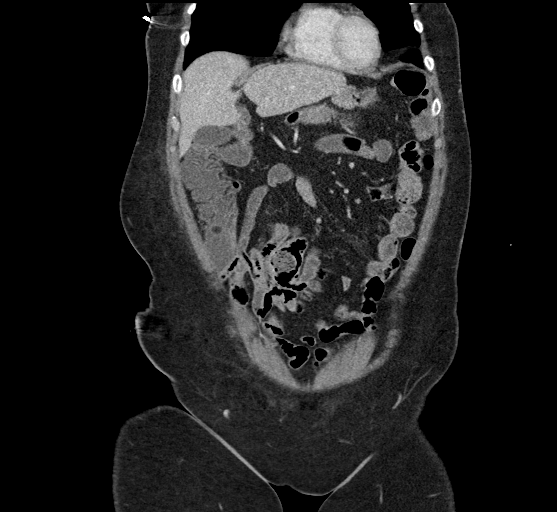
[im 52/117  soft-tissue]
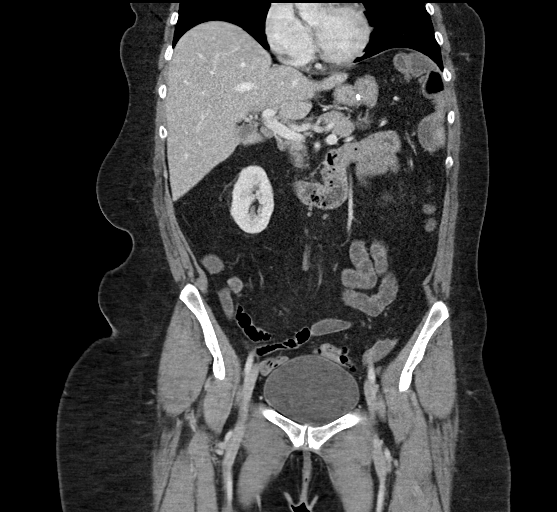
[im 65/117  soft-tissue]
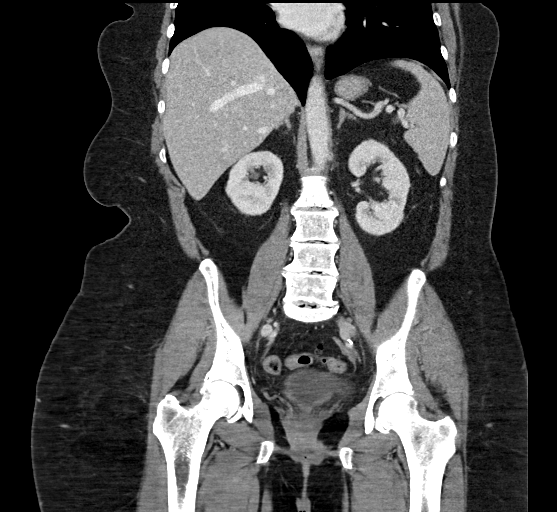

[15 of 42 positions shown; findings below may reference images not displayed]

FINDINGS: Lower chest: Lung bases are clear. No effusions. Heart is normal
size.

Hepatobiliary: No focal hepatic abnormality. Gallbladder
unremarkable.

Pancreas: No focal abnormality or ductal dilatation.

Spleen: No focal abnormality.  Normal size.

Adrenals/Urinary Tract: No adrenal abnormality. No focal renal
abnormality. No stones or hydronephrosis. Urinary bladder is
unremarkable.

Stomach/Bowel: Sigmoid diverticulosis. No active diverticulitis.
Appendix is normal. Stomach and small bowel decompressed, grossly
unremarkable. Postoperative changes in the stomach.

Vascular/Lymphatic: Aortic atherosclerosis. No enlarged abdominal or
pelvic lymph nodes.

Reproductive: Prior hysterectomy.  No adnexal masses.

Other: No free fluid or free air. There is a fluid collection within
the right anterior abdominal wall measuring 8.3 x 5.7 cm concerning
for abscess.

Musculoskeletal: No acute bony abnormality.
IMPRESSION: 8.3 x 5.7 cm fluid collection in the right anterior abdominal wall
most compatible with abscess. No intraperitoneal extension.

Aortic atherosclerosis.

Sigmoid diverticulosis.
